# Patient Record
Sex: Female | Born: 1995 | Race: Black or African American | Hispanic: No | Marital: Single | Smoking: Never smoker
Health system: Southern US, Community
[De-identification: ages and names within clinical notes are randomized; demographics above are authoritative.]

## PROBLEM LIST (undated history)

## (undated) ENCOUNTER — Inpatient Hospital Stay (HOSPITAL_COMMUNITY): Payer: Self-pay

## (undated) DIAGNOSIS — Z3480 Encounter for supervision of other normal pregnancy, unspecified trimester: Secondary | ICD-10-CM

## (undated) DIAGNOSIS — J45909 Unspecified asthma, uncomplicated: Secondary | ICD-10-CM

## (undated) DIAGNOSIS — N809 Endometriosis, unspecified: Secondary | ICD-10-CM

## (undated) HISTORY — PX: NO PAST SURGERIES: SHX2092

## (undated) HISTORY — DX: Encounter for supervision of other normal pregnancy, unspecified trimester: Z34.80

## (undated) HISTORY — DX: Endometriosis, unspecified: N80.9

---
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
## 2014-04-21 ENCOUNTER — Inpatient Hospital Stay (HOSPITAL_COMMUNITY)
Admission: AD | Admit: 2014-04-21 | Discharge: 2014-04-21 | Discharge status: Against Medical Advice | Payer: Self-pay | Source: Ambulatory Visit | Attending: Obstetrics & Gynecology | Admitting: Obstetrics & Gynecology

## 2014-04-21 NOTE — MAU Note (Signed)
Refused to sign paperwork to be seen- pt left with out being seen

## 2016-06-06 ENCOUNTER — Encounter (HOSPITAL_COMMUNITY): Payer: Self-pay | Admitting: Emergency Medicine

## 2016-06-06 ENCOUNTER — Emergency Department (HOSPITAL_COMMUNITY)
Admission: EM | Admit: 2016-06-06 | Discharge: 2016-06-07 | Disposition: A | Discharge status: Home / Self Care | Payer: No Typology Code available for payment source | Attending: Emergency Medicine | Admitting: Emergency Medicine

## 2016-06-06 DIAGNOSIS — Y999 Unspecified external cause status: Secondary | ICD-10-CM | POA: Insufficient documentation

## 2016-06-06 DIAGNOSIS — Y92410 Unspecified street and highway as the place of occurrence of the external cause: Secondary | ICD-10-CM | POA: Insufficient documentation

## 2016-06-06 DIAGNOSIS — J45909 Unspecified asthma, uncomplicated: Secondary | ICD-10-CM | POA: Diagnosis not present

## 2016-06-06 DIAGNOSIS — S0990XA Unspecified injury of head, initial encounter: Secondary | ICD-10-CM | POA: Insufficient documentation

## 2016-06-06 DIAGNOSIS — Y939 Activity, unspecified: Secondary | ICD-10-CM | POA: Insufficient documentation

## 2016-06-06 DIAGNOSIS — R918 Other nonspecific abnormal finding of lung field: Secondary | ICD-10-CM | POA: Diagnosis not present

## 2016-06-06 DIAGNOSIS — R109 Unspecified abdominal pain: Secondary | ICD-10-CM | POA: Insufficient documentation

## 2016-06-06 DIAGNOSIS — S20219A Contusion of unspecified front wall of thorax, initial encounter: Secondary | ICD-10-CM | POA: Insufficient documentation

## 2016-06-06 DIAGNOSIS — Z79899 Other long term (current) drug therapy: Secondary | ICD-10-CM | POA: Diagnosis not present

## 2016-06-06 DIAGNOSIS — S29001A Unspecified injury of muscle and tendon of front wall of thorax, initial encounter: Secondary | ICD-10-CM | POA: Diagnosis present

## 2016-06-06 DIAGNOSIS — T07XXXA Unspecified multiple injuries, initial encounter: Secondary | ICD-10-CM

## 2016-06-06 HISTORY — DX: Unspecified asthma, uncomplicated: J45.909

## 2016-06-06 LAB — URINALYSIS, ROUTINE W REFLEX MICROSCOPIC
Bilirubin Urine: NEGATIVE
GLUCOSE, UA: NEGATIVE mg/dL
Ketones, ur: NEGATIVE mg/dL
Leukocytes, UA: NEGATIVE
Nitrite: POSITIVE — AB
Protein, ur: NEGATIVE mg/dL
SPECIFIC GRAVITY, URINE: 1.01 (ref 1.005–1.030)
pH: 6 (ref 5.0–8.0)

## 2016-06-06 LAB — CBC
HCT: 40.2 % (ref 36.0–46.0)
Hemoglobin: 13.5 g/dL (ref 12.0–15.0)
MCH: 26.3 pg (ref 26.0–34.0)
MCHC: 33.6 g/dL (ref 30.0–36.0)
MCV: 78.2 fL (ref 78.0–100.0)
Platelets: 431 10*3/uL — ABNORMAL HIGH (ref 150–400)
RBC: 5.14 MIL/uL — ABNORMAL HIGH (ref 3.87–5.11)
RDW: 13.8 % (ref 11.5–15.5)
WBC: 13.1 10*3/uL — AB (ref 4.0–10.5)

## 2016-06-06 LAB — COMPREHENSIVE METABOLIC PANEL
ALT: 17 U/L (ref 14–54)
AST: 25 U/L (ref 15–41)
Albumin: 4.7 g/dL (ref 3.5–5.0)
Alkaline Phosphatase: 90 U/L (ref 38–126)
Anion gap: 11 (ref 5–15)
BUN: 8 mg/dL (ref 6–20)
CHLORIDE: 100 mmol/L — AB (ref 101–111)
CO2: 27 mmol/L (ref 22–32)
Calcium: 9.8 mg/dL (ref 8.9–10.3)
Creatinine, Ser: 0.91 mg/dL (ref 0.44–1.00)
GFR calc Af Amer: >60 mL/min (ref >60)
GLUCOSE: 100 mg/dL — AB (ref 65–99)
Potassium: 3.9 mmol/L (ref 3.5–5.1)
Sodium: 138 mmol/L (ref 135–145)
TOTAL PROTEIN: 8 g/dL (ref 6.5–8.1)
Total Bilirubin: 0.3 mg/dL (ref 0.3–1.2)

## 2016-06-06 LAB — POC URINE PREG, ED: Preg Test, Ur: NEGATIVE

## 2016-06-06 LAB — ETHANOL

## 2016-06-06 MED ORDER — FENTANYL CITRATE (PF) 100 MCG/2ML IJ SOLN
100.0000 ug | Freq: Once | INTRAMUSCULAR | Status: AC
  Administered 2016-06-06: 100 ug via INTRAVENOUS
  Filled 2016-06-06: qty 2

## 2016-06-06 MED ORDER — IOPAMIDOL (ISOVUE-300) INJECTION 61%
INTRAVENOUS | Status: AC
  Administered 2016-06-07: 100 mL
  Filled 2016-06-06: qty 100

## 2016-06-06 MED ORDER — FOSFOMYCIN TROMETHAMINE 3 G PO PACK
3.0000 g | PACK | Freq: Once | ORAL | Status: AC
  Administered 2016-06-07: 3 g via ORAL
  Filled 2016-06-06: qty 3
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
## 2016-06-06 NOTE — ED Notes (Signed)
edp attempting a [place] iv
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
## 2016-06-06 NOTE — ED Notes (Signed)
Pt undressed completely for the xray exam

## 2016-06-06 NOTE — ED Notes (Signed)
Patient ambulated to bathroom with only assistance with getting up.

## 2016-06-06 NOTE — ED Notes (Signed)
Patient continues to pull off monitor.

## 2016-06-07 ENCOUNTER — Emergency Department (HOSPITAL_COMMUNITY): Payer: No Typology Code available for payment source

## 2016-06-07 ENCOUNTER — Encounter (HOSPITAL_COMMUNITY): Payer: Self-pay

## 2016-06-07 MED ORDER — IBUPROFEN 800 MG PO TABS: 800.0000 mg | ORAL_TABLET | Freq: Three times a day (TID) | ORAL | 0 refills | Status: DC

## 2016-06-07 NOTE — ED Notes (Signed)
Ambulated with no difficulty  Drinking ok
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
## 2016-06-07 NOTE — ED Notes (Signed)
Pt to ct 

## 2016-06-07 NOTE — ED Notes (Signed)
Pt back for ct

## 2016-06-07 NOTE — ED Notes (Signed)
Iv [place] catheter removed by the c-t tech

## 2016-06-07 NOTE — ED Notes (Signed)
Iv team here to  Attempt a a-c iv for c-t angio.  Pt not  Feeling much relief from pain

## 2016-06-07 NOTE — ED Notes (Signed)
Back to c-t for head
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
## 2016-06-09 ENCOUNTER — Encounter (HOSPITAL_BASED_OUTPATIENT_CLINIC_OR_DEPARTMENT_OTHER): Payer: Self-pay | Admitting: Emergency Medicine

## 2016-06-09 ENCOUNTER — Emergency Department (HOSPITAL_BASED_OUTPATIENT_CLINIC_OR_DEPARTMENT_OTHER)
Admission: EM | Admit: 2016-06-09 | Discharge: 2016-06-10 | Disposition: A | Discharge status: Home / Self Care | Payer: Self-pay | Attending: Emergency Medicine | Admitting: Emergency Medicine

## 2016-06-09 DIAGNOSIS — Y92410 Unspecified street and highway as the place of occurrence of the external cause: Secondary | ICD-10-CM | POA: Insufficient documentation

## 2016-06-09 DIAGNOSIS — J45909 Unspecified asthma, uncomplicated: Secondary | ICD-10-CM | POA: Insufficient documentation

## 2016-06-09 DIAGNOSIS — N3000 Acute cystitis without hematuria: Secondary | ICD-10-CM | POA: Insufficient documentation

## 2016-06-09 DIAGNOSIS — Y999 Unspecified external cause status: Secondary | ICD-10-CM | POA: Insufficient documentation

## 2016-06-09 DIAGNOSIS — S301XXA Contusion of abdominal wall, initial encounter: Secondary | ICD-10-CM | POA: Insufficient documentation

## 2016-06-09 DIAGNOSIS — Y939 Activity, unspecified: Secondary | ICD-10-CM | POA: Insufficient documentation

## 2016-06-09 LAB — URINE CULTURE
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
## 2016-06-10 ENCOUNTER — Telehealth: Payer: Self-pay | Admitting: Emergency Medicine

## 2016-06-10 MED ORDER — TRAMADOL HCL 50 MG PO TABS: 50.0000 mg | ORAL_TABLET | Freq: Four times a day (QID) | ORAL | 0 refills | Status: DC | PRN

## 2016-06-10 MED ORDER — CIPROFLOXACIN HCL 500 MG PO TABS: 500.0000 mg | ORAL_TABLET | Freq: Two times a day (BID) | ORAL | 0 refills | Status: DC

## 2016-06-10 NOTE — ED Notes (Addendum)
EDP into room, prior to RN assessment, see MD notes, pending orders, care assumed at time of d/c.

## 2016-06-10 NOTE — Discharge Instructions (Signed)
Tramadol as prescribed as needed for pain.  Cipro as prescribed for treatment of urinary tract infection.  Return to the emergency department if your symptoms significantly worsen or change, and follow-up with your primary Dr. if not improving in the next week.

## 2016-06-10 NOTE — ED Notes (Signed)
Pt not visualized by this RN

## 2016-06-10 NOTE — Telephone Encounter (Signed)
Post ED Visit - Positive Culture Follow-up  Culture report reviewed by antimicrobial stewardship pharmacist:   Enzo Bi, Pharm.D.  Celedonio Miyamoto, Pharm.D., BCPS AQ-ID  Garvin Fila, Pharm.D., BCPS  Georgina Pillion, Pharm.D., BCPS  [place], [address].D., BCPS, AAHIVP  Estella Husk, Pharm.D., BCPS, AAHIVP  Lysle Pearl, PharmD, BCPS  Casilda Carls, PharmD, BCPS  Pollyann Samples, PharmD, BCPS  Positive urine culture Treated with ciprofloxacin, organism sensitive to the same and no further patient follow-up is required at this time.  Berle Mull 06/10/2016, 9:53 AM
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
## 2016-07-31 ENCOUNTER — Encounter (HOSPITAL_BASED_OUTPATIENT_CLINIC_OR_DEPARTMENT_OTHER): Payer: Self-pay | Admitting: *Deleted

## 2016-07-31 ENCOUNTER — Emergency Department (HOSPITAL_BASED_OUTPATIENT_CLINIC_OR_DEPARTMENT_OTHER)
Admission: EM | Admit: 2016-07-31 | Discharge: 2016-07-31 | Disposition: A | Discharge status: Home / Self Care | Payer: Self-pay | Attending: Emergency Medicine | Admitting: Emergency Medicine

## 2016-07-31 DIAGNOSIS — R1013 Epigastric pain: Secondary | ICD-10-CM | POA: Insufficient documentation

## 2016-07-31 DIAGNOSIS — J45909 Unspecified asthma, uncomplicated: Secondary | ICD-10-CM | POA: Insufficient documentation

## 2016-07-31 LAB — COMPREHENSIVE METABOLIC PANEL
ALT: 18 U/L (ref 14–54)
ANION GAP: 8 (ref 5–15)
AST: 21 U/L (ref 15–41)
Albumin: 4.1 g/dL (ref 3.5–5.0)
Alkaline Phosphatase: 90 U/L (ref 38–126)
BILIRUBIN TOTAL: 0.3 mg/dL (ref 0.3–1.2)
BUN: 6 mg/dL (ref 6–20)
CO2: 24 mmol/L (ref 22–32)
Calcium: 9 mg/dL (ref 8.9–10.3)
Chloride: 107 mmol/L (ref 101–111)
Creatinine, Ser: 0.66 mg/dL (ref 0.44–1.00)
GFR calc Af Amer: >60 mL/min (ref >60)
Glucose, Bld: 96 mg/dL (ref 65–99)
POTASSIUM: 3.4 mmol/L — AB (ref 3.5–5.1)
Sodium: 139 mmol/L (ref 135–145)
TOTAL PROTEIN: 7.5 g/dL (ref 6.5–8.1)

## 2016-07-31 LAB — CBC WITH DIFFERENTIAL/PLATELET
BASOS ABS: 0 10*3/uL (ref 0.0–0.1)
Basophils Relative: 0 %
EOS PCT: 1 %
Eosinophils Absolute: 0.1 10*3/uL (ref 0.0–0.7)
HEMATOCRIT: 37.3 % (ref 36.0–46.0)
HEMOGLOBIN: 12.9 g/dL (ref 12.0–15.0)
LYMPHS PCT: 31 %
Lymphs Abs: 1.8 10*3/uL (ref 0.7–4.0)
MCH: 26.7 pg (ref 26.0–34.0)
MCHC: 34.6 g/dL (ref 30.0–36.0)
MCV: 77.1 fL — AB (ref 78.0–100.0)
Monocytes Absolute: 0.7 10*3/uL (ref 0.1–1.0)
Monocytes Relative: 13 %
NEUTROS ABS: 3.1 10*3/uL (ref 1.7–7.7)
NEUTROS PCT: 55 %
PLATELETS: 411 10*3/uL — AB (ref 150–400)
RBC: 4.84 MIL/uL (ref 3.87–5.11)
RDW: 13.3 % (ref 11.5–15.5)
WBC: 5.7 10*3/uL (ref 4.0–10.5)

## 2016-07-31 LAB — HCG, QUANTITATIVE, PREGNANCY: hCG, Beta Chain, Quant, S: <1 m[IU]/mL (ref <5)

## 2016-07-31 LAB — LIPASE, BLOOD: Lipase: 18 U/L (ref 11–51)

## 2016-07-31 MED ORDER — GI COCKTAIL ~~LOC~~
30.0000 mL | Freq: Once | ORAL | Status: AC
  Administered 2016-07-31: 30 mL via ORAL
  Filled 2016-07-31: qty 30

## 2016-07-31 NOTE — Discharge Instructions (Signed)
Prilosec 20 mg twice daily for the next 2 weeks.  Follow-up with your primary Dr. if you're not improving in the next week.
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
## 2016-12-30 ENCOUNTER — Encounter (HOSPITAL_COMMUNITY): Payer: Self-pay | Admitting: *Deleted

## 2016-12-30 ENCOUNTER — Inpatient Hospital Stay (HOSPITAL_COMMUNITY)
Admission: AD | Admit: 2016-12-30 | Discharge: 2016-12-30 | Disposition: A | Discharge status: Home / Self Care | Payer: Medicaid Other | Source: Ambulatory Visit | Attending: Obstetrics & Gynecology | Admitting: Obstetrics & Gynecology

## 2016-12-30 DIAGNOSIS — O0932 Supervision of pregnancy with insufficient antenatal care, second trimester: Secondary | ICD-10-CM | POA: Insufficient documentation

## 2016-12-30 DIAGNOSIS — R109 Unspecified abdominal pain: Secondary | ICD-10-CM | POA: Diagnosis not present

## 2016-12-30 DIAGNOSIS — Z3492 Encounter for supervision of normal pregnancy, unspecified, second trimester: Secondary | ICD-10-CM

## 2016-12-30 DIAGNOSIS — N898 Other specified noninflammatory disorders of vagina: Secondary | ICD-10-CM | POA: Diagnosis not present

## 2016-12-30 DIAGNOSIS — Z3A22 22 weeks gestation of pregnancy: Secondary | ICD-10-CM | POA: Insufficient documentation

## 2016-12-30 DIAGNOSIS — O26892 Other specified pregnancy related conditions, second trimester: Secondary | ICD-10-CM | POA: Diagnosis not present

## 2016-12-30 DIAGNOSIS — Z885 Allergy status to narcotic agent status: Secondary | ICD-10-CM | POA: Diagnosis not present

## 2016-12-30 LAB — URINALYSIS, ROUTINE W REFLEX MICROSCOPIC
BILIRUBIN URINE: NEGATIVE
Glucose, UA: NEGATIVE mg/dL
Hgb urine dipstick: NEGATIVE
Ketones, ur: NEGATIVE mg/dL
Leukocytes, UA: NEGATIVE
NITRITE: NEGATIVE
Protein, ur: NEGATIVE mg/dL
SPECIFIC GRAVITY, URINE: 1.012 (ref 1.005–1.030)
pH: 6 (ref 5.0–8.0)

## 2016-12-30 LAB — WET PREP, GENITAL
Clue Cells Wet Prep HPF POC: NONE SEEN
SPERM: NONE SEEN
TRICH WET PREP: NONE SEEN
YEAST WET PREP: NONE SEEN

## 2016-12-30 NOTE — MAU Note (Signed)
Pt presents with c/o upper shooting abdominal pain that began 2 weeks ago.  Denies VB.  Denies heavy lifting.

## 2016-12-30 NOTE — MAU Provider Note (Signed)
History    CSN: [id number]  Arrival date and time: 12/30/16 0900   First Provider Initiated Contact with Patient 12/30/16 [phone]      Chief Complaint  Patient presents with  . Abdominal Pain   HPI  Anne Jensen is a 21 y/o G1P0 who presents today for abdominal pain for the last 2 weeks that she describes as cramping and shooting in nature. The pain is intermittent, localized to the level of the umbilicus and starts in one side of her abdomen and radiates across to the other side. She rates the pain as a 6/10 when it occurs. The patient reports that stressful situations at her old job causes onset of the pain, and that massaging the area or applying a warm wash cloth to her stomach alleviates the pain. The patient reports feeling normal fetal movement. Patient reports a white mucus discharge that has been present since she became pregnant. Patient denies pain radiating to the back, low back pain, nausea, vomiting, bleeding, leaking of fluids, headache, fever, urinary urgency or dysuria. She has not received any prenatal care since she found out she was [redacted] weeks pregnant.   OB History    Gravida Para Term Preterm AB Living   1 0 0 0 0 0   SAB TAB Ectopic Multiple Live Births   0 0 0 0        Past Medical History:  Diagnosis Date  . Asthma     Past Surgical History:  Procedure Laterality Date  . NO PAST SURGERIES      History reviewed. No pertinent family history.  Social History  Substance Use Topics  . Smoking status: Never Smoker  . Smokeless tobacco: Never Used  . Alcohol use No    Allergies:  Allergies  Allergen Reactions  . Morphine And Related Other (See Comments)    Makes skin burn    No prescriptions prior to admission.    Review of Systems  Constitutional: Negative for fever.  HENT: Negative.   Eyes: Negative.   Respiratory: Negative for shortness of breath.   Cardiovascular: Negative for chest pain.  Gastrointestinal: Positive for abdominal pain.  Negative for constipation, diarrhea, nausea and vomiting.  Genitourinary: Positive for vaginal discharge (white mucus consistency present throughout entire pregnancy). Negative for dysuria, flank pain, hematuria, pelvic pain, urgency and vaginal bleeding.  Musculoskeletal: Negative for back pain.  Skin: Negative.   Neurological: Negative for headaches.  Psychiatric/Behavioral: Negative.    Physical Exam   Blood pressure 121/64, pulse 83, temperature 98.3 F (36.8 C), temperature source Oral, resp. rate 20, height 5\' 6"  (1.676 m), weight 101.8 kg (224 lb 8 oz), last menstrual period 07/26/2016, SpO2 99 %, not currently breastfeeding.  Physical Exam  Constitutional: She is oriented to person, place, and time. She appears well-developed and well-nourished.  HENT:  Head: Normocephalic and atraumatic.  Cardiovascular: Normal rate and regular rhythm.   Respiratory: Effort normal and breath sounds normal.  GI: Soft. Bowel sounds are normal. There is no tenderness. There is no rebound and no guarding.  Genitourinary: Cervix exhibits no discharge and no friability. Vaginal discharge found.  Genitourinary Comments: Cervix is closed  Musculoskeletal: Normal range of motion.  Neurological: She is alert and oriented to person, place, and time.  Skin: Skin is warm and dry.  Psychiatric: She has a normal mood and affect. Her behavior is normal. Thought content normal.    MAU Course  Procedures  MDM Pelvic exam GC/CHL swab Wet prep Urinalysis  Assessment and Plan  1. Abdominal pain in pregnancy - Abdominal pain from normal uterine enlargement during pregnancy. Patient was advised to return if she experiences contractions that occur regularly and frequently and 6 or more in 1 hour, as well as vaginal bleeding. 2. Late prenatal care - Patient was given a list of providers in the area and encouraged to follow-up immediately to initiate prenatal care. 3 Wet prep - negative 4. Urinalysis -  negative 5. GC/CHL - results pending, patient advised that she will be notified if the results come back positive for GC or CHL.  Julieanne Mansonebecca Antuan Limes, PA-S 12/30/2016, 11:02 AM

## 2016-12-30 NOTE — MAU Provider Note (Signed)
History     CSN: [id number]  Arrival date and time: 12/30/16 0900   First Provider Initiated Contact with Patient 12/30/16 [phone]      Chief Complaint  Patient presents with  . Abdominal Pain   HPI Anne Jensen is a 21 y.o. G1P0000 at [id number] who presents with abdominal pain. Patient has had no prenatal care during this pregnancy. Reports intermittent lower abdominal pain for the last 2 weeks. Can't tell how frequently pain is occurring but dose not occur more than once per hour. Describes as shooting pain that radiates from level of umbilicus down to bilateral lower abdomen. Rates pain 6/10 when it occurs. Endorses small amount of thin white discharge with the pregnancy. No vaginal irritation or odor. Denies n/v/d, constipation, dysuria, or vaginal bleeding. Positive fetal movement.   OB History    Gravida Para Term Preterm AB Living   1 0 0 0 0 0   SAB TAB Ectopic Multiple Live Births   0 0 0 0        Past Medical History:  Diagnosis Date  . Asthma     Past Surgical History:  Procedure Laterality Date  . NO PAST SURGERIES      History reviewed. No pertinent family history.  Social History  Substance Use Topics  . Smoking status: Never Smoker  . Smokeless tobacco: Never Used  . Alcohol use No    Allergies:  Allergies  Allergen Reactions  . Morphine And Related Other (See Comments)    Makes skin burn    No prescriptions prior to admission.    Review of Systems  Constitutional: Negative.   Gastrointestinal: Positive for abdominal pain. Negative for constipation, diarrhea, nausea and vomiting.  Genitourinary: Positive for vaginal discharge. Negative for dysuria and vaginal bleeding.   Physical Exam   Blood pressure 101/61, pulse 75, temperature 98 F (36.7 C), temperature source Oral, resp. rate 18, height 5\' 6"  (1.676 m), weight 224 lb 8 oz (101.8 kg), last menstrual period 07/26/2016, SpO2 100 %, not currently breastfeeding.  Physical Exam  Nursing  note and vitals reviewed. Constitutional: She is oriented to person, place, and time. She appears well-developed and well-nourished. No distress.  HENT:  Head: Normocephalic and atraumatic.  Eyes: Conjunctivae are normal. Right eye exhibits no discharge. Left eye exhibits no discharge. No scleral icterus.  Neck: Normal range of motion.  Respiratory: Effort normal. No respiratory distress.  GI: Soft. There is no tenderness.  Fundal height at umbilicus  Genitourinary: Vaginal discharge (small amount of physiologic discharge) found.  Genitourinary Comments: Cervix closed/thick  Neurological: She is alert and oriented to person, place, and time.  Skin: Skin is warm and dry. She is not diaphoretic.  Psychiatric: She has a normal mood and affect. Her behavior is normal. Judgment and thought content normal.    MAU Course  Procedures Results for orders placed or performed during the hospital encounter of 12/30/16 (from the past 24 hour(s))  Urinalysis, Routine w reflex microscopic     Status: None   Collection Time: 12/30/16  9:15 AM  Result Value Ref Range   Color, Urine YELLOW YELLOW   APPearance CLEAR CLEAR   Specific Gravity, Urine 1.012 1.005 - 1.030   pH 6.0 5.0 - 8.0   Glucose, UA NEGATIVE NEGATIVE mg/dL   Hgb urine dipstick NEGATIVE NEGATIVE   Bilirubin Urine NEGATIVE NEGATIVE   Ketones, ur NEGATIVE NEGATIVE mg/dL   Protein, ur NEGATIVE NEGATIVE mg/dL   Nitrite NEGATIVE NEGATIVE   Leukocytes,  UA NEGATIVE NEGATIVE  Wet prep, genital     Status: Abnormal   Collection Time: 12/30/16 10:35 AM  Result Value Ref Range   Yeast Wet Prep HPF POC NONE SEEN NONE SEEN   Trich, Wet Prep NONE SEEN NONE SEEN   Clue Cells Wet Prep HPF POC NONE SEEN NONE SEEN   WBC, Wet Prep HPF POC FEW (A) NONE SEEN   Sperm NONE SEEN     MDM FHT 145 Cervix closed GC/CT & wet prep collected  Assessment and Plan  A: 1. Abdominal pain during pregnancy in second trimester   2. Fetal heart tones  present, second trimester   3. Vaginal discharge during pregnancy in second trimester   4. [redacted] weeks gestation of pregnancy   5. No prenatal care in current pregnancy in second trimester    P: Discharge home Start prenatal care ASAP --- given list of ob providers GC/CT pending Discussed reasons to return to MAU  Judeth Horn 12/30/2016, 7:40 PM

## 2016-12-30 NOTE — Discharge Instructions (Signed)
Second Trimester of Pregnancy The second trimester is from week 14 through week 27 (months 4 through 6). The second trimester is often a time when you feel your best. Your body has adjusted to being pregnant, and you begin to feel better physically. Usually, morning sickness has lessened or quit completely, you may have more energy, and you may have an increase in appetite. The second trimester is also a time when the fetus is growing rapidly. At the end of the sixth month, the fetus is about 9 inches long and weighs about 1 pounds. You will likely begin to feel the baby move (quickening) between 16 and 20 weeks of pregnancy. Body changes during your second trimester Your body continues to go through many changes during your second trimester. The changes vary from woman to woman.  Your weight will continue to increase. You will notice your lower abdomen bulging out.  You may begin to get stretch marks on your hips, abdomen, and breasts.  You may develop headaches that can be relieved by medicines. The medicines should be approved by your health care provider.  You may urinate more often because the fetus is pressing on your bladder.  You may develop or continue to have heartburn as a result of your pregnancy.  You may develop constipation because certain hormones are causing the muscles that push waste through your intestines to slow down.  You may develop hemorrhoids or swollen, bulging veins (varicose veins).  You may have back pain. This is caused by: ? Weight gain. ? Pregnancy hormones that are relaxing the joints in your pelvis. ? A shift in weight and the muscles that support your balance.  Your breasts will continue to grow and they will continue to become tender.  Your gums may bleed and may be sensitive to brushing and flossing.  Dark spots or blotches (chloasma, mask of pregnancy) may develop on your face. This will likely fade after the baby is born.  A dark line from your  belly button to the pubic area (linea nigra) may appear. This will likely fade after the baby is born.  You may have changes in your hair. These can include thickening of your hair, rapid growth, and changes in texture. Some women also have hair loss during or after pregnancy, or hair that feels dry or thin. Your hair will most likely return to normal after your baby is born.  What to expect at prenatal visits During a routine prenatal visit:  You will be weighed to make sure you and the fetus are growing normally.  Your blood pressure will be taken.  Your abdomen will be measured to track your baby's growth.  The fetal heartbeat will be listened to.  Any test results from the previous visit will be discussed.  Your health care provider may ask you:  How you are feeling.  If you are feeling the baby move.  If you have had any abnormal symptoms, such as leaking fluid, bleeding, severe headaches, or abdominal cramping.  If you are using any tobacco products, including cigarettes, chewing tobacco, and electronic cigarettes.  If you have any questions.  Other tests that may be performed during your second trimester include:  Blood tests that check for: ? Low iron levels (anemia). ? High blood sugar that affects pregnant women (gestational diabetes) between 13 and 28 weeks. ? Rh antibodies. This is to check for a protein on red blood cells (Rh factor).  Urine tests to check for infections, diabetes, or  protein in the urine.  An ultrasound to confirm the proper growth and development of the baby.  An amniocentesis to check for possible genetic problems.  Fetal screens for spina bifida and Down syndrome.  HIV (human immunodeficiency virus) testing. Routine prenatal testing includes screening for HIV, unless you choose not to have this test.  Follow these instructions at home: Medicines  Follow your health care provider's instructions regarding medicine use. Specific  medicines may be either safe or unsafe to take during pregnancy.  Take a prenatal vitamin that contains at least 600 micrograms (mcg) of folic acid.  If you develop constipation, try taking a stool softener if your health care provider approves. Eating and drinking  Eat a balanced diet that includes fresh fruits and vegetables, whole grains, good sources of protein such as meat, eggs, or tofu, and low-fat dairy. Your health care provider will help you determine the amount of weight gain that is right for you.  Avoid raw meat and uncooked cheese. These carry germs that can cause birth defects in the baby.  If you have low calcium intake from food, talk to your health care provider about whether you should take a daily calcium supplement.  Limit foods that are high in fat and processed sugars, such as fried and sweet foods.  To prevent constipation: ? Drink enough fluid to keep your urine clear or pale yellow. ? Eat foods that are high in fiber, such as fresh fruits and vegetables, whole grains, and beans. Activity  Exercise only as directed by your health care provider. Most women can continue their usual exercise routine during pregnancy. Try to exercise for 30 minutes at least 5 days a week. Stop exercising if you experience uterine contractions.  Avoid heavy lifting, wear low heel shoes, and practice good posture.  A sexual relationship may be continued unless your health care provider directs you otherwise. Relieving pain and discomfort  Wear a good support bra to prevent discomfort from breast tenderness.  Take warm sitz baths to soothe any pain or discomfort caused by hemorrhoids. Use hemorrhoid cream if your health care provider approves.  Rest with your legs elevated if you have leg cramps or low back pain.  If you develop varicose veins, wear support hose. Elevate your feet for 15 minutes, 3-4 times a day. Limit salt in your diet. Prenatal Care  Write down your questions.  Take them to your prenatal visits.  Keep all your prenatal visits as told by your health care provider. This is important. Safety  Wear your seat belt at all times when driving.  Make a list of emergency phone numbers, including numbers for family, friends, the hospital, and police and fire departments. General instructions  Ask your health care provider for a referral to a local prenatal education class. Begin classes no later than the beginning of month 6 of your pregnancy.  Ask for help if you have counseling or nutritional needs during pregnancy. Your health care provider can offer advice or refer you to specialists for help with various needs.  Do not use hot tubs, steam rooms, or saunas.  Do not douche or use tampons or scented sanitary pads.  Do not cross your legs for long periods of time.  Avoid cat litter boxes and soil used by cats. These carry germs that can cause birth defects in the baby and possibly loss of the fetus by miscarriage or stillbirth.  Avoid all smoking, herbs, alcohol, and unprescribed drugs. Chemicals in these products can  affect the formation and growth of the baby.  Do not use any products that contain nicotine or tobacco, such as cigarettes and e-cigarettes. If you need help quitting, ask your health care provider.  Visit your dentist if you have not gone yet during your pregnancy. Use a soft toothbrush to brush your teeth and be gentle when you floss. Contact a health care provider if:  You have dizziness.  You have mild pelvic cramps, pelvic pressure, or nagging pain in the abdominal area.  You have persistent nausea, vomiting, or diarrhea.  You have a bad smelling vaginal discharge.  You have pain when you urinate. Get help right away if:  You have a fever.  You are leaking fluid from your vagina.  You have spotting or bleeding from your vagina.  You have severe abdominal cramping or pain.  You have rapid weight gain or weight  loss.  You have shortness of breath with chest pain.  You notice sudden or extreme swelling of your face, hands, ankles, feet, or legs.  You have not felt your baby move in over an hour.  You have severe headaches that do not go away when you take medicine.  You have vision changes. Summary  The second trimester is from week 14 through week 27 (months 4 through 6). It is also a time when the fetus is growing rapidly.  Your body goes through many changes during pregnancy. The changes vary from woman to woman.  Avoid all smoking, herbs, alcohol, and unprescribed drugs. These chemicals affect the formation and growth your baby.  Do not use any tobacco products, such as cigarettes, chewing tobacco, and e-cigarettes. If you need help quitting, ask your health care provider.  Contact your health care provider if you have any questions. Keep all prenatal visits as told by your health care provider. This is important. This information is not intended to replace advice given to you by your health care provider. Make sure you discuss any questions you have with your health care provider. Document Released: 02/12/2001 Document Revised: 07/27/2015 Document Reviewed: 04/21/2012 Elsevier Interactive Patient Education  2017 [employer]. Preterm Labor and Birth Information The normal length of a pregnancy is 39-41 weeks. Preterm labor is when labor starts before 37 completed weeks of pregnancy. What are the risk factors for preterm labor? Preterm labor is more likely to occur in women who:  Have certain infections during pregnancy such as a bladder infection, sexually transmitted infection, or infection inside the uterus (chorioamnionitis).  Have a shorter-than-normal cervix.  Have gone into preterm labor before.  Have had surgery on their cervix.  Are younger than age 49 or older than age 87.  Are African American.  Are pregnant with twins or multiple babies (multiple gestation).  Take  street drugs or smoke while pregnant.  Do not gain enough weight while pregnant.  Became pregnant shortly after having been pregnant.  What are the symptoms of preterm labor? Symptoms of preterm labor include:  Cramps similar to those that can happen during a menstrual period. The cramps may happen with diarrhea.  Pain in the abdomen or lower back.  Regular uterine contractions that may feel like tightening of the abdomen.  A feeling of increased pressure in the pelvis.  Increased watery or bloody mucus discharge from the vagina.  Water breaking (ruptured amniotic sac).  Why is it important to recognize signs of preterm labor? It is important to recognize signs of preterm labor because babies who are born prematurely  may not be fully developed. This can put them at an increased risk for:  Long-term (chronic) heart and lung problems.  Difficulty immediately after birth with regulating body systems, including blood sugar, body temperature, heart rate, and breathing rate.  Bleeding in the brain.  Cerebral palsy.  Learning difficulties.  Death.  These risks are highest for babies who are born before 34 weeks of pregnancy. How is preterm labor treated? Treatment depends on the length of your pregnancy, your condition, and the health of your baby. It may involve:  Having a stitch (suture) placed in your cervix to prevent your cervix from opening too early (cerclage).  Taking or being given medicines, such as: ? Hormone medicines. These may be given early in pregnancy to help support the pregnancy. ? Medicine to stop contractions. ? Medicines to help mature the babys lungs. These may be prescribed if the risk of delivery is high. ? Medicines to prevent your baby from developing cerebral palsy.  If the labor happens before 34 weeks of pregnancy, you may need to stay in the hospital. What should I do if I think I am in preterm labor? If you think that you are going into  preterm labor, call your health care provider right away. How can I prevent preterm labor in future pregnancies? To increase your chance of having a full-term pregnancy:  Do not use any tobacco products, such as cigarettes, chewing tobacco, and e-cigarettes. If you need help quitting, ask your health care provider.  Do not use street drugs or medicines that have not been prescribed to you during your pregnancy.  Talk with your health care provider before taking any herbal supplements, even if you have been taking them regularly.  Make sure you gain a healthy amount of weight during your pregnancy.  Watch for infection. If you think that you might have an infection, get it checked right away.  Make sure to tell your health care provider if you have gone into preterm labor before.  This information is not intended to replace advice given to you by your health care provider. Make sure you discuss any questions you have with your health care provider. Document Released: 05/11/2003 Document Revised: 08/01/2015 Document Reviewed: 07/12/2015 Elsevier Interactive Patient Education  2018 [employer].       [facility] Prenatal Care Providers   [facility]       Phone: [phone]  [facility] [place] Phone: [phone]  [facility] [place]  Phone: [phone]  [facility]  Phone: [phone]  [facility] [place]  Phone: [phone]  [facility]       Phone: [phone]  [facility]    Phone: [phone]   [facility]    Phone: [phone]  [facility]    Phone: [phone]  [facility]    Phone: [phone]   [facility]  Phone: [phone]  [facility]    Phone: [phone]  [facility]   Phone: [phone]  [facility]    Phone: [phone]

## 2016-12-31 LAB — GC/CHLAMYDIA PROBE AMP (~~LOC~~) NOT AT ARMC
Chlamydia: NEGATIVE
NEISSERIA GONORRHEA: NEGATIVE

## 2017-02-02 ENCOUNTER — Other Ambulatory Visit: Payer: Self-pay

## 2017-02-02 ENCOUNTER — Emergency Department (HOSPITAL_COMMUNITY)
Admission: EM | Admit: 2017-02-02 | Discharge: 2017-02-02 | Disposition: A | Discharge status: Home / Self Care | Payer: Medicaid Other | Attending: Emergency Medicine | Admitting: Emergency Medicine

## 2017-02-02 ENCOUNTER — Encounter (HOSPITAL_COMMUNITY): Payer: Self-pay

## 2017-02-02 ENCOUNTER — Inpatient Hospital Stay (EMERGENCY_DEPARTMENT_HOSPITAL)
Admission: AD | Admit: 2017-02-02 | Discharge: 2017-02-02 | Disposition: A | Discharge status: Home / Self Care | Payer: Medicaid Other | Source: Ambulatory Visit | Attending: Obstetrics & Gynecology | Admitting: Obstetrics & Gynecology

## 2017-02-02 ENCOUNTER — Encounter (HOSPITAL_COMMUNITY): Payer: Self-pay | Admitting: Emergency Medicine

## 2017-02-02 DIAGNOSIS — Z3689 Encounter for other specified antenatal screening: Secondary | ICD-10-CM

## 2017-02-02 DIAGNOSIS — R200 Anesthesia of skin: Secondary | ICD-10-CM | POA: Insufficient documentation

## 2017-02-02 DIAGNOSIS — Z3A27 27 weeks gestation of pregnancy: Secondary | ICD-10-CM | POA: Diagnosis not present

## 2017-02-02 DIAGNOSIS — O9989 Other specified diseases and conditions complicating pregnancy, childbirth and the puerperium: Secondary | ICD-10-CM | POA: Diagnosis present

## 2017-02-02 DIAGNOSIS — O26892 Other specified pregnancy related conditions, second trimester: Secondary | ICD-10-CM | POA: Insufficient documentation

## 2017-02-02 DIAGNOSIS — O0932 Supervision of pregnancy with insufficient antenatal care, second trimester: Secondary | ICD-10-CM

## 2017-02-02 DIAGNOSIS — O368120 Decreased fetal movements, second trimester, not applicable or unspecified: Secondary | ICD-10-CM | POA: Diagnosis not present

## 2017-02-02 DIAGNOSIS — Z3A24 24 weeks gestation of pregnancy: Secondary | ICD-10-CM | POA: Insufficient documentation

## 2017-02-02 DIAGNOSIS — O209 Hemorrhage in early pregnancy, unspecified: Secondary | ICD-10-CM | POA: Diagnosis not present

## 2017-02-02 DIAGNOSIS — Z5321 Procedure and treatment not carried out due to patient leaving prior to being seen by health care provider: Secondary | ICD-10-CM | POA: Insufficient documentation

## 2017-02-02 LAB — URINALYSIS, MICROSCOPIC (REFLEX)

## 2017-02-02 LAB — RAPID URINE DRUG SCREEN, HOSP PERFORMED
AMPHETAMINES: NOT DETECTED
BENZODIAZEPINES: NOT DETECTED
Barbiturates: NOT DETECTED
Cocaine: NOT DETECTED
OPIATES: NOT DETECTED
TETRAHYDROCANNABINOL: POSITIVE — AB

## 2017-02-02 LAB — URINALYSIS, ROUTINE W REFLEX MICROSCOPIC
Bilirubin Urine: NEGATIVE
GLUCOSE, UA: NEGATIVE mg/dL
Hgb urine dipstick: NEGATIVE
Ketones, ur: NEGATIVE mg/dL
Nitrite: NEGATIVE
PROTEIN: NEGATIVE mg/dL
SPECIFIC GRAVITY, URINE: 1.01 (ref 1.005–1.030)
pH: 6.5 (ref 5.0–8.0)

## 2017-02-02 NOTE — Discharge Instructions (Signed)
Second Trimester of Pregnancy The second trimester is from week 13 through week 28, month 4 through 6. This is often the time in pregnancy that you feel your best. Often times, morning sickness has lessened or quit. You may have more energy, and you may get hungry more often. Your unborn baby (fetus) is growing rapidly. At the end of the sixth month, he or she is about 9 inches long and weighs about 1 pounds. You will likely feel the baby move (quickening) between 18 and 20 weeks of pregnancy. Follow these instructions at home:  Avoid all smoking, herbs, and alcohol. Avoid drugs not approved by your doctor.  Do not use any tobacco products, including cigarettes, chewing tobacco, and electronic cigarettes. If you need help quitting, ask your doctor. You may get counseling or other support to help you quit.  Only take medicine as told by your doctor. Some medicines are safe and some are not during pregnancy.  Exercise only as told by your doctor. Stop exercising if you start having cramps.  Eat regular, healthy meals.  Wear a good support bra if your breasts are tender.  Do not use hot tubs, steam rooms, or saunas.  Wear your seat belt when driving.  Avoid raw meat, uncooked cheese, and liter boxes and soil used by cats.  Take your prenatal vitamins.  Take 1500-2000 milligrams of calcium daily starting at the 20th week of pregnancy until you deliver your baby.  Try taking medicine that helps you poop (stool softener) as needed, and if your doctor approves. Eat more fiber by eating fresh fruit, vegetables, and whole grains. Drink enough fluids to keep your pee (urine) clear or pale yellow.  Take warm water baths (sitz baths) to soothe pain or discomfort caused by hemorrhoids. Use hemorrhoid cream if your doctor approves.  If you have puffy, bulging veins (varicose veins), wear support hose. Raise (elevate) your feet for 15 minutes, 3-4 times a day. Limit salt in your diet.  Avoid heavy  lifting, wear low heals, and sit up straight.  Rest with your legs raised if you have leg cramps or low back pain.  Visit your dentist if you have not gone during your pregnancy. Use a soft toothbrush to brush your teeth. Be gentle when you floss.  You can have sex (intercourse) unless your doctor tells you not to.  Go to your doctor visits. Get help if:  You feel dizzy.  You have mild cramps or pressure in your lower belly (abdomen).  You have a nagging pain in your belly area.  You continue to feel sick to your stomach (nauseous), throw up (vomit), or have watery poop (diarrhea).  You have bad smelling fluid coming from your vagina.  You have pain with peeing (urination). Get help right away if:  You have a fever.  You are leaking fluid from your vagina.  You have spotting or bleeding from your vagina.  You have severe belly cramping or pain.  You lose or gain weight rapidly.  You have trouble catching your breath and have chest pain.  You notice sudden or extreme puffiness (swelling) of your face, hands, ankles, feet, or legs.  You have not felt the baby move in over an hour.  You have severe headaches that do not go away with medicine.  You have vision changes. This information is not intended to replace advice given to you by your health care provider. Make sure you discuss any questions you have with your health care   provider. Document Released: 05/15/2009 Document Revised: 07/27/2015 Document Reviewed: 04/21/2012 Elsevier Interactive Patient Education  2017 Elsevier Inc.  

## 2017-02-02 NOTE — ED Triage Notes (Addendum)
Reports feeling numbness to Left lower leg after sitting in the floor.  Coming and going for two days.  Also worried that she hasn't felt the baby move since noon on Saturday.  Reports being 6 months pregnant.  Last US at 8 weeks.  Has not seen obgyn.  Having baby check ups in the ed per pt.  [facility].  No pain since.  Did reports spotting today.

## 2017-02-02 NOTE — ED Notes (Signed)
Pt not in waiting room when called to room.

## 2017-02-02 NOTE — MAU Note (Addendum)
Abdominal pain since yesterday, was upper abdominal pain now lower.  No prenatal care other than visits to MAU.

## 2017-02-02 NOTE — MAU Provider Note (Signed)
History     CSN: [id number]  Arrival date and time: 02/02/17 1202   First Provider Initiated Contact with Patient 02/02/17 1313      Chief Complaint  Patient presents with  . Abdominal Pain   HPI   [name].Anne Jensen is a 21 y.o. female G1P0000 @ 399w2d here in MAU with complaints of decreased fetal movement. States she has not felt her baby move since last night. States she still has not felt her baby kick. Says she put her hand on her stomach and could not feel the babies heart beat, which she normally feels.  No vaginal bleeding. Having some upper abdominal pain. Says her sister had a baby around the same gestation as she is now due to the placenta. Patient is requesting an US today since she has not started prenatal care.   States she has an appointment with Dr. Shawnie Ponsorn on 02/10/2017  OB History    Gravida Para Term Preterm AB Living   1 0 0 0 0 0   SAB TAB Ectopic Multiple Live Births   0 0 0 0        Past Medical History:  Diagnosis Date  . Asthma     Past Surgical History:  Procedure Laterality Date  . NO PAST SURGERIES      History reviewed. No pertinent family history.  Social History   Tobacco Use  . Smoking status: Never Jensen  . Smokeless tobacco: Never Used  Substance Use Topics  . Alcohol use: No  . Drug use: No    Allergies:  Allergies  Allergen Reactions  . Morphine And Related Other (See Comments)    Makes skin burn    Medications Prior to Admission  Medication Sig Dispense Refill Last Dose  . Prenatal Vit-Fe Fumarate-FA (PRENATAL MULTIVITAMIN) TABS tablet Take 1 tablet by mouth daily at 12 noon.   02/01/2017 at Unknown time   Results for orders placed or performed during the hospital encounter of 02/02/17 (from the past 48 hour(s))  Urinalysis, Routine w reflex microscopic     Status: Abnormal   Collection Time: 02/02/17 12:15 PM  Result Value Ref Range   Color, Urine YELLOW YELLOW   APPearance HAZY (A) CLEAR   Specific Gravity, Urine  1.010 1.005 - 1.030   pH 6.5 5.0 - 8.0   Glucose, UA NEGATIVE NEGATIVE mg/dL   Hgb urine dipstick NEGATIVE NEGATIVE   Bilirubin Urine NEGATIVE NEGATIVE   Ketones, ur NEGATIVE NEGATIVE mg/dL   Protein, ur NEGATIVE NEGATIVE mg/dL   Nitrite NEGATIVE NEGATIVE   Leukocytes, UA MODERATE (A) NEGATIVE  Urine rapid drug screen (hosp performed)     Status: Abnormal   Collection Time: 02/02/17 12:15 PM  Result Value Ref Range   Opiates NONE DETECTED NONE DETECTED   Cocaine NONE DETECTED NONE DETECTED   Benzodiazepines NONE DETECTED NONE DETECTED   Amphetamines NONE DETECTED NONE DETECTED   Tetrahydrocannabinol POSITIVE (A) NONE DETECTED   Barbiturates NONE DETECTED NONE DETECTED    Comment:        DRUG SCREEN FOR MEDICAL PURPOSES ONLY.  IF CONFIRMATION IS NEEDED FOR ANY PURPOSE, NOTIFY LAB WITHIN 5 DAYS.        LOWEST DETECTABLE LIMITS FOR URINE DRUG SCREEN Drug Class       Cutoff (ng/mL) Amphetamine      1000 Barbiturate      200 Benzodiazepine   200 Tricyclics       300 Opiates  300 Cocaine          300 THC              50   Urinalysis, Microscopic (reflex)     Status: Abnormal   Collection Time: 02/02/17 12:15 PM  Result Value Ref Range   RBC / HPF 0-5 0 - 5 RBC/hpf   WBC, UA 0-5 0 - 5 WBC/hpf    Comment: REPEATED TO VERIFY   Bacteria, UA FEW (A) NONE SEEN   Squamous Epithelial / LPF 0-5 (A) NONE SEEN   Review of Systems  Constitutional: Negative for fever.  Gastrointestinal: Positive for abdominal pain.  Genitourinary: Negative for dysuria.   Physical Exam   Blood pressure (!) 106/59, pulse 93, temperature 98.2 F (36.8 C), resp. rate 18, last menstrual period 07/26/2016.  Physical Exam  Constitutional: She is oriented to person, place, and time. She appears well-developed and well-nourished. No distress.  HENT:  Head: Normocephalic.  Eyes: Pupils are equal, round, and reactive to light.  GI: Soft. She exhibits no distension. There is no tenderness. There  is no rebound.  Musculoskeletal: Normal range of motion.  Neurological: She is alert and oriented to person, place, and time.  Skin: Skin is warm. She is not diaphoretic.  Psychiatric: Her behavior is normal.   Fetal Tracing: Baseline: 135 bpm Variability: Moderate  Accelerations: 15x15 Decelerations: None Toco: None  MAU Course  Procedures  None  MDM  Audible fetal kicks heard on fetal monitor.  UA UDS Apple juice given to the patient; patient feeling regular movements since oral hydration.   Assessment and Plan   A:  Problem List Items Addressed This Visit    None    Visit Diagnoses    No prenatal care in current pregnancy in second trimester    -  Primary   Relevant Orders   US MFM OB DETAIL +14 WK   Decreased fetal movements in second trimester, single or unspecified fetus       NST (non-stress test) reactive          P:   Discharge home with strict return precautions Reactive NST today US ordered for anatomy scan Keep your appointment with Dr. Shawnie Ponsorn as scheduled Return to MAU if symptoms [facility]  Jaydynn Wolford, Harolyn RutherfordJennifer I, NP 02/02/2017 9:04 PM

## 2017-02-02 NOTE — Progress Notes (Signed)
D/c instructions given with pt understanding. Pt left unit via ambulatory.

## 2017-02-05 ENCOUNTER — Ambulatory Visit (HOSPITAL_COMMUNITY)
Admission: RE | Admit: 2017-02-05 | Discharge: 2017-02-05 | Disposition: A | Discharge status: Home / Self Care | Payer: Medicaid Other | Source: Ambulatory Visit | Attending: Obstetrics and Gynecology | Admitting: Obstetrics and Gynecology

## 2017-02-05 ENCOUNTER — Other Ambulatory Visit (HOSPITAL_COMMUNITY): Payer: Self-pay | Admitting: Obstetrics and Gynecology

## 2017-02-05 ENCOUNTER — Other Ambulatory Visit (HOSPITAL_COMMUNITY): Payer: Self-pay | Admitting: *Deleted

## 2017-02-05 DIAGNOSIS — E669 Obesity, unspecified: Secondary | ICD-10-CM | POA: Diagnosis not present

## 2017-02-05 DIAGNOSIS — Z3A27 27 weeks gestation of pregnancy: Secondary | ICD-10-CM | POA: Insufficient documentation

## 2017-02-05 DIAGNOSIS — O0932 Supervision of pregnancy with insufficient antenatal care, second trimester: Secondary | ICD-10-CM

## 2017-02-05 DIAGNOSIS — Z3689 Encounter for other specified antenatal screening: Secondary | ICD-10-CM | POA: Diagnosis not present

## 2017-02-05 DIAGNOSIS — O99212 Obesity complicating pregnancy, second trimester: Secondary | ICD-10-CM

## 2017-02-05 DIAGNOSIS — Q6689 Other  specified congenital deformities of feet: Secondary | ICD-10-CM

## 2017-02-26 ENCOUNTER — Other Ambulatory Visit (HOSPITAL_COMMUNITY): Payer: Self-pay | Admitting: Maternal and Fetal Medicine

## 2017-02-26 ENCOUNTER — Other Ambulatory Visit (HOSPITAL_COMMUNITY)
Admission: RE | Admit: 2017-02-26 | Discharge: 2017-02-26 | Disposition: A | Discharge status: Home / Self Care | Payer: Medicaid Other | Source: Ambulatory Visit | Attending: Obstetrics | Admitting: Obstetrics

## 2017-02-26 ENCOUNTER — Ambulatory Visit (HOSPITAL_COMMUNITY)
Admission: RE | Admit: 2017-02-26 | Discharge: 2017-02-26 | Disposition: A | Discharge status: Home / Self Care | Payer: Medicaid Other | Source: Ambulatory Visit | Attending: Obstetrics and Gynecology | Admitting: Obstetrics and Gynecology

## 2017-02-26 ENCOUNTER — Encounter: Payer: Self-pay | Admitting: Obstetrics

## 2017-02-26 ENCOUNTER — Ambulatory Visit (INDEPENDENT_AMBULATORY_CARE_PROVIDER_SITE_OTHER): Payer: Medicaid Other | Admitting: Obstetrics

## 2017-02-26 ENCOUNTER — Encounter (HOSPITAL_COMMUNITY): Payer: Self-pay

## 2017-02-26 DIAGNOSIS — Z3A30 30 weeks gestation of pregnancy: Secondary | ICD-10-CM | POA: Insufficient documentation

## 2017-02-26 DIAGNOSIS — O99213 Obesity complicating pregnancy, third trimester: Secondary | ICD-10-CM

## 2017-02-26 DIAGNOSIS — O358XX0 Maternal care for other (suspected) fetal abnormality and damage, not applicable or unspecified: Secondary | ICD-10-CM | POA: Diagnosis not present

## 2017-02-26 DIAGNOSIS — O0933 Supervision of pregnancy with insufficient antenatal care, third trimester: Secondary | ICD-10-CM | POA: Diagnosis not present

## 2017-02-26 DIAGNOSIS — Z362 Encounter for other antenatal screening follow-up: Secondary | ICD-10-CM

## 2017-02-26 DIAGNOSIS — E669 Obesity, unspecified: Secondary | ICD-10-CM | POA: Insufficient documentation

## 2017-02-26 DIAGNOSIS — Z3480 Encounter for supervision of other normal pregnancy, unspecified trimester: Secondary | ICD-10-CM

## 2017-02-26 DIAGNOSIS — Z3483 Encounter for supervision of other normal pregnancy, third trimester: Secondary | ICD-10-CM | POA: Diagnosis not present

## 2017-02-26 DIAGNOSIS — Q6689 Other  specified congenital deformities of feet: Secondary | ICD-10-CM

## 2017-02-26 DIAGNOSIS — O35HXX0 Maternal care for other (suspected) fetal abnormality and damage, fetal lower extremities anomalies, not applicable or unspecified: Secondary | ICD-10-CM

## 2017-02-26 DIAGNOSIS — O0990 Supervision of high risk pregnancy, unspecified, unspecified trimester: Secondary | ICD-10-CM | POA: Insufficient documentation

## 2017-02-26 HISTORY — DX: Encounter for supervision of other normal pregnancy, unspecified trimester: Z34.80

## 2017-02-27 LAB — OBSTETRIC PANEL, INCLUDING HIV
ANTIBODY SCREEN: NEGATIVE
BASOS: 0 %
Basophils Absolute: 0 10*3/uL (ref 0.0–0.2)
EOS (ABSOLUTE): 0.1 10*3/uL (ref 0.0–0.4)
Eos: 1 %
HEMATOCRIT: 31.1 % — AB (ref 34.0–46.6)
HIV SCREEN 4TH GENERATION: NONREACTIVE
Hemoglobin: 10.6 g/dL — ABNORMAL LOW (ref 11.1–15.9)
Hepatitis B Surface Ag: NEGATIVE
Immature Grans (Abs): 0.1 10*3/uL (ref 0.0–0.1)
Immature Granulocytes: 1 %
LYMPHS ABS: 2.1 10*3/uL (ref 0.7–3.1)
LYMPHS: 17 %
MCH: 26 pg — AB (ref 26.6–33.0)
MCHC: 34.1 g/dL (ref 31.5–35.7)
MCV: 76 fL — AB (ref 79–97)
MONOS ABS: 1.1 10*3/uL — AB (ref 0.1–0.9)
Monocytes: 9 %
NEUTROS ABS: 9.1 10*3/uL — AB (ref 1.4–7.0)
Neutrophils: 72 %
Platelets: 448 10*3/uL — ABNORMAL HIGH (ref 150–379)
RBC: 4.07 x10E6/uL (ref 3.77–5.28)
RDW: 13.8 % (ref 12.3–15.4)
RPR Ser Ql: NONREACTIVE
Rh Factor: POSITIVE
Rubella Antibodies, IGG: 1.32 index (ref >0.99)
WBC: 12.4 10*3/uL — AB (ref 3.4–10.8)

## 2017-02-28 ENCOUNTER — Encounter: Payer: Self-pay | Admitting: Obstetrics

## 2017-02-28 LAB — URINE CULTURE, OB REFLEX

## 2017-02-28 LAB — URINE CYTOLOGY ANCILLARY ONLY
Chlamydia: NEGATIVE
Neisseria Gonorrhea: NEGATIVE
Trichomonas: NEGATIVE

## 2017-02-28 LAB — CULTURE, OB URINE

## 2017-02-28 NOTE — Progress Notes (Signed)
Subjective:  Anne Jensen is a 21 y.o. G1P0000 at 343w0d being seen today for ongoing prenatal care.  She is currently monitored for the following issues for this low-risk pregnancy and has Supervision of other normal pregnancy, antepartum on their problem list.  Patient reports no complaints.  Contractions: Not present. Vag. Bleeding: None.  Movement: Present. Denies leaking of fluid.   The following portions of the patient's history were reviewed and updated as appropriate: allergies, current medications, past family history, past medical history, past social history, past surgical history and problem list. Problem list updated.  Objective:   Vitals:   02/26/17 1350  BP: 118/75  Pulse: 95  Weight: 237 lb (107.5 kg)    Fetal Status: Fetal Heart Rate (bpm): 150   Movement: Present     General:  Alert, oriented and cooperative. Patient is in no acute distress.  Skin: Skin is warm and dry. No rash noted.   Cardiovascular: Normal heart rate noted  Respiratory: Normal respiratory effort, no problems with respiration noted  Abdomen: Soft, gravid, appropriate for gestational age. Pain/Pressure: Present     Pelvic:  Cervical exam deferred        Extremities: Normal range of motion.     Mental Status: Normal mood and affect. Normal behavior. Normal judgment and thought content.   Urinalysis:      Assessment and Plan:  Pregnancy: G1P0000 at 413w0d  1. Supervision of other normal pregnancy, antepartum Rx: - Obstetric Panel, Including HIV - Culture, OB Urine - Urine cytology ancillary only  Preterm labor symptoms and general obstetric precautions including but not limited to vaginal bleeding, contractions, leaking of fluid and fetal movement were reviewed in detail with the patient. Please refer to After Visit Summary for other counseling recommendations.  Return in about 2 weeks (around 03/12/2017) for ROB.   Brock BadHarper, Glendine Swetz A, MD

## 2017-03-04 NOTE — L&D Delivery Note (Signed)
Delivery Note At 8:02 PM a viable female was delivered via Vaginal, Spontaneous (Presentation: LOA).  APGAR: 8, 9; weight pending.   Placenta status: spontaneous, intact.  Cord: 3 vessels, noted to be short.   Anesthesia:  nitrous Episiotomy:  n/a Lacerations:  n/a Suture Repair: n/a Est. Blood Loss (mL):    Mom to postpartum.  Baby to Couplet care / Skin to Skin.  Rolm BookbinderCaroline M Neill 04/30/2017, 8:12 PM  Please schedule this patient for Postpartum visit in: 4 weeks with the following provider: Any provider For C/S patients schedule nurse incision check in weeks 2 weeks: no Low risk pregnancy complicated by: n/a Delivery mode:  SVD Anticipated Birth Control:  other/unsure PP Procedures needed: n/a  Schedule Integrated BH visit: no

## 2017-03-05 ENCOUNTER — Other Ambulatory Visit: Payer: Medicaid Other

## 2017-03-05 DIAGNOSIS — Z3480 Encounter for supervision of other normal pregnancy, unspecified trimester: Secondary | ICD-10-CM

## 2017-03-06 ENCOUNTER — Other Ambulatory Visit: Payer: Self-pay | Admitting: Obstetrics

## 2017-03-06 DIAGNOSIS — D508 Other iron deficiency anemias: Secondary | ICD-10-CM

## 2017-03-06 LAB — CBC
HEMATOCRIT: 30.4 % — AB (ref 34.0–46.6)
HEMOGLOBIN: 10.5 g/dL — AB (ref 11.1–15.9)
MCH: 25.7 pg — ABNORMAL LOW (ref 26.6–33.0)
MCHC: 34.5 g/dL (ref 31.5–35.7)
MCV: 75 fL — ABNORMAL LOW (ref 79–97)
Platelets: 406 10*3/uL — ABNORMAL HIGH (ref 150–379)
RBC: 4.08 x10E6/uL (ref 3.77–5.28)
RDW: 12.8 % (ref 12.3–15.4)
WBC: 11.7 10*3/uL — ABNORMAL HIGH (ref 3.4–10.8)

## 2017-03-06 LAB — GLUCOSE TOLERANCE, 2 HOURS W/ 1HR
Glucose, 1 hour: 173 mg/dL (ref 65–179)
Glucose, 2 hour: 159 mg/dL — ABNORMAL HIGH (ref 65–152)
Glucose, Fasting: 83 mg/dL (ref 65–91)

## 2017-03-06 LAB — RPR: RPR Ser Ql: NONREACTIVE

## 2017-03-06 LAB — HIV ANTIBODY (ROUTINE TESTING W REFLEX): HIV Screen 4th Generation wRfx: NONREACTIVE

## 2017-03-06 MED ORDER — FERROUS SULFATE 325 (65 FE) MG PO TABS: 325.0000 mg | ORAL_TABLET | Freq: Every day | ORAL | 11 refills | Status: DC

## 2017-03-12 ENCOUNTER — Other Ambulatory Visit: Payer: Self-pay

## 2017-03-12 ENCOUNTER — Encounter: Payer: Self-pay | Admitting: Obstetrics and Gynecology

## 2017-03-12 ENCOUNTER — Encounter: Payer: Medicaid Other | Admitting: Obstetrics

## 2017-03-12 DIAGNOSIS — Q6689 Other  specified congenital deformities of feet: Secondary | ICD-10-CM | POA: Insufficient documentation

## 2017-03-12 DIAGNOSIS — O24419 Gestational diabetes mellitus in pregnancy, unspecified control: Secondary | ICD-10-CM | POA: Insufficient documentation

## 2017-03-12 DIAGNOSIS — D508 Other iron deficiency anemias: Secondary | ICD-10-CM

## 2017-03-12 DIAGNOSIS — J45909 Unspecified asthma, uncomplicated: Secondary | ICD-10-CM | POA: Insufficient documentation

## 2017-03-12 MED ORDER — ACCU-CHEK GUIDE W/DEVICE KIT: 1.0000 | PACK | Freq: Four times a day (QID) | 0 refills | Status: DC

## 2017-03-12 MED ORDER — GLUCOSE BLOOD VI STRP
ORAL_STRIP | 12 refills | Status: DC
Start: 2017-03-12 — End: 2017-04-29

## 2017-03-17 ENCOUNTER — Other Ambulatory Visit (HOSPITAL_COMMUNITY): Payer: Self-pay | Admitting: *Deleted

## 2017-03-17 DIAGNOSIS — O359XX0 Maternal care for (suspected) fetal abnormality and damage, unspecified, not applicable or unspecified: Secondary | ICD-10-CM

## 2017-03-17 DIAGNOSIS — Q6689 Other  specified congenital deformities of feet: Secondary | ICD-10-CM

## 2017-03-19 ENCOUNTER — Ambulatory Visit (HOSPITAL_COMMUNITY)
Admission: RE | Admit: 2017-03-19 | Discharge: 2017-03-19 | Disposition: A | Discharge status: Home / Self Care | Payer: Medicaid Other | Source: Ambulatory Visit | Attending: Obstetrics | Admitting: Obstetrics

## 2017-03-25 ENCOUNTER — Other Ambulatory Visit (HOSPITAL_COMMUNITY): Payer: Self-pay | Admitting: Maternal and Fetal Medicine

## 2017-03-25 ENCOUNTER — Encounter (HOSPITAL_COMMUNITY): Payer: Self-pay

## 2017-03-25 ENCOUNTER — Ambulatory Visit (HOSPITAL_COMMUNITY)
Admission: RE | Admit: 2017-03-25 | Discharge: 2017-03-25 | Disposition: A | Discharge status: Home / Self Care | Payer: Medicaid Other | Source: Ambulatory Visit | Attending: Obstetrics | Admitting: Obstetrics

## 2017-03-25 ENCOUNTER — Encounter: Payer: Self-pay | Admitting: Advanced Practice Midwife

## 2017-03-25 DIAGNOSIS — O359XX0 Maternal care for (suspected) fetal abnormality and damage, unspecified, not applicable or unspecified: Secondary | ICD-10-CM | POA: Diagnosis present

## 2017-03-25 DIAGNOSIS — Z3480 Encounter for supervision of other normal pregnancy, unspecified trimester: Secondary | ICD-10-CM

## 2017-03-25 DIAGNOSIS — O365930 Maternal care for other known or suspected poor fetal growth, third trimester, not applicable or unspecified: Secondary | ICD-10-CM

## 2017-03-25 DIAGNOSIS — O0933 Supervision of pregnancy with insufficient antenatal care, third trimester: Secondary | ICD-10-CM

## 2017-03-25 DIAGNOSIS — O09893 Supervision of other high risk pregnancies, third trimester: Secondary | ICD-10-CM | POA: Insufficient documentation

## 2017-03-25 DIAGNOSIS — Q6689 Other  specified congenital deformities of feet: Secondary | ICD-10-CM | POA: Insufficient documentation

## 2017-03-25 DIAGNOSIS — E669 Obesity, unspecified: Secondary | ICD-10-CM | POA: Diagnosis not present

## 2017-03-25 DIAGNOSIS — O99213 Obesity complicating pregnancy, third trimester: Secondary | ICD-10-CM

## 2017-03-25 DIAGNOSIS — O24410 Gestational diabetes mellitus in pregnancy, diet controlled: Secondary | ICD-10-CM

## 2017-03-25 DIAGNOSIS — Z3A34 34 weeks gestation of pregnancy: Secondary | ICD-10-CM | POA: Diagnosis not present

## 2017-03-25 DIAGNOSIS — IMO0002 Reserved for concepts with insufficient information to code with codable children: Secondary | ICD-10-CM | POA: Insufficient documentation

## 2017-03-26 ENCOUNTER — Ambulatory Visit: Payer: Medicaid Other | Admitting: Registered"

## 2017-04-14 ENCOUNTER — Other Ambulatory Visit (HOSPITAL_COMMUNITY): Payer: Self-pay | Admitting: *Deleted

## 2017-04-14 DIAGNOSIS — O359XX0 Maternal care for (suspected) fetal abnormality and damage, unspecified, not applicable or unspecified: Secondary | ICD-10-CM

## 2017-04-15 ENCOUNTER — Telehealth: Payer: Self-pay

## 2017-04-15 NOTE — Telephone Encounter (Signed)
Returned call, left vm.

## 2017-04-17 ENCOUNTER — Encounter: Payer: Medicaid Other | Admitting: Certified Nurse Midwife

## 2017-04-22 ENCOUNTER — Encounter (HOSPITAL_COMMUNITY): Payer: Self-pay

## 2017-04-22 ENCOUNTER — Other Ambulatory Visit (HOSPITAL_COMMUNITY): Payer: Self-pay | Admitting: Obstetrics and Gynecology

## 2017-04-22 ENCOUNTER — Ambulatory Visit (HOSPITAL_COMMUNITY)
Admission: RE | Admit: 2017-04-22 | Discharge: 2017-04-22 | Disposition: A | Discharge status: Home / Self Care | Payer: Medicaid Other | Source: Ambulatory Visit | Attending: Obstetrics | Admitting: Obstetrics

## 2017-04-22 DIAGNOSIS — Z3A38 38 weeks gestation of pregnancy: Secondary | ICD-10-CM | POA: Diagnosis not present

## 2017-04-22 DIAGNOSIS — O359XX0 Maternal care for (suspected) fetal abnormality and damage, unspecified, not applicable or unspecified: Secondary | ICD-10-CM

## 2017-04-22 DIAGNOSIS — O365930 Maternal care for other known or suspected poor fetal growth, third trimester, not applicable or unspecified: Secondary | ICD-10-CM | POA: Diagnosis not present

## 2017-04-22 NOTE — Addendum Note (Signed)
Encounter addended by: Lenise ArenaBazemore, Klark Vanderhoef, RDMS on: 04/22/2017 1:00 PM  Actions taken: Imaging Exam ended

## 2017-04-25 ENCOUNTER — Encounter: Payer: Medicaid Other | Admitting: Certified Nurse Midwife

## 2017-04-28 ENCOUNTER — Encounter: Payer: Medicaid Other | Admitting: Certified Nurse Midwife

## 2017-04-29 ENCOUNTER — Inpatient Hospital Stay (HOSPITAL_COMMUNITY)
Admission: AD | Admit: 2017-04-29 | Discharge: 2017-05-02 | DRG: 807 | Disposition: A | Discharge status: Home / Self Care | Payer: Medicaid Other | Source: Ambulatory Visit | Attending: Obstetrics & Gynecology | Admitting: Obstetrics & Gynecology

## 2017-04-29 ENCOUNTER — Encounter: Payer: Self-pay | Admitting: Certified Nurse Midwife

## 2017-04-29 ENCOUNTER — Ambulatory Visit (INDEPENDENT_AMBULATORY_CARE_PROVIDER_SITE_OTHER): Payer: Medicaid Other | Admitting: Certified Nurse Midwife

## 2017-04-29 ENCOUNTER — Encounter (HOSPITAL_COMMUNITY): Payer: Self-pay | Admitting: *Deleted

## 2017-04-29 VITALS — BP 110/74 | HR 97 | Wt 252.4 lb

## 2017-04-29 DIAGNOSIS — O365990 Maternal care for other known or suspected poor fetal growth, unspecified trimester, not applicable or unspecified: Secondary | ICD-10-CM | POA: Diagnosis present

## 2017-04-29 DIAGNOSIS — IMO0002 Reserved for concepts with insufficient information to code with codable children: Secondary | ICD-10-CM | POA: Diagnosis present

## 2017-04-29 DIAGNOSIS — O358XX0 Maternal care for other (suspected) fetal abnormality and damage, not applicable or unspecified: Secondary | ICD-10-CM | POA: Diagnosis present

## 2017-04-29 DIAGNOSIS — O365930 Maternal care for other known or suspected poor fetal growth, third trimester, not applicable or unspecified: Principal | ICD-10-CM | POA: Diagnosis present

## 2017-04-29 DIAGNOSIS — O0990 Supervision of high risk pregnancy, unspecified, unspecified trimester: Secondary | ICD-10-CM

## 2017-04-29 DIAGNOSIS — Z3A39 39 weeks gestation of pregnancy: Secondary | ICD-10-CM

## 2017-04-29 DIAGNOSIS — O0993 Supervision of high risk pregnancy, unspecified, third trimester: Secondary | ICD-10-CM

## 2017-04-29 DIAGNOSIS — O24419 Gestational diabetes mellitus in pregnancy, unspecified control: Secondary | ICD-10-CM | POA: Diagnosis present

## 2017-04-29 DIAGNOSIS — O24420 Gestational diabetes mellitus in childbirth, diet controlled: Secondary | ICD-10-CM | POA: Diagnosis present

## 2017-04-29 DIAGNOSIS — O24429 Gestational diabetes mellitus in childbirth, unspecified control: Secondary | ICD-10-CM | POA: Diagnosis not present

## 2017-04-29 LAB — COMPREHENSIVE METABOLIC PANEL
ALBUMIN: 3.2 g/dL — AB (ref 3.5–5.0)
ALT: 15 U/L (ref 14–54)
AST: 19 U/L (ref 15–41)
Alkaline Phosphatase: 120 U/L (ref 38–126)
Anion gap: 10 (ref 5–15)
BILIRUBIN TOTAL: 0.7 mg/dL (ref 0.3–1.2)
BUN: 7 mg/dL (ref 6–20)
CHLORIDE: 105 mmol/L (ref 101–111)
CO2: 18 mmol/L — ABNORMAL LOW (ref 22–32)
Calcium: 9.1 mg/dL (ref 8.9–10.3)
Creatinine, Ser: 0.55 mg/dL (ref 0.44–1.00)
GFR calc Af Amer: >60 mL/min (ref >60)
GFR calc non Af Amer: >60 mL/min (ref >60)
GLUCOSE: 84 mg/dL (ref 65–99)
POTASSIUM: 3.9 mmol/L (ref 3.5–5.1)
SODIUM: 133 mmol/L — AB (ref 135–145)
TOTAL PROTEIN: 6.7 g/dL (ref 6.5–8.1)

## 2017-04-29 LAB — GROUP B STREP BY PCR: Group B strep by PCR: NEGATIVE

## 2017-04-29 LAB — OB RESULTS CONSOLE GC/CHLAMYDIA: Gonorrhea: NEGATIVE

## 2017-04-29 LAB — RAPID URINE DRUG SCREEN, HOSP PERFORMED
Amphetamines: NOT DETECTED
BENZODIAZEPINES: NOT DETECTED
Barbiturates: NOT DETECTED
Cocaine: NOT DETECTED
OPIATES: NOT DETECTED
Tetrahydrocannabinol: NOT DETECTED

## 2017-04-29 LAB — GLUCOSE, CAPILLARY
GLUCOSE-CAPILLARY: 107 mg/dL — AB (ref 65–99)
GLUCOSE-CAPILLARY: 82 mg/dL (ref 65–99)
GLUCOSE-CAPILLARY: 85 mg/dL (ref 65–99)

## 2017-04-29 LAB — CBC
HEMATOCRIT: 32.3 % — AB (ref 36.0–46.0)
Hemoglobin: 10.9 g/dL — ABNORMAL LOW (ref 12.0–15.0)
MCH: 25.8 pg — ABNORMAL LOW (ref 26.0–34.0)
MCHC: 33.7 g/dL (ref 30.0–36.0)
MCV: 76.5 fL — ABNORMAL LOW (ref 78.0–100.0)
PLATELETS: 364 10*3/uL (ref 150–400)
RBC: 4.22 MIL/uL (ref 3.87–5.11)
RDW: 13.7 % (ref 11.5–15.5)
WBC: 8.5 10*3/uL (ref 4.0–10.5)

## 2017-04-29 LAB — TYPE AND SCREEN
ABO/RH(D): O POS
Antibody Screen: NEGATIVE

## 2017-04-29 LAB — ABO/RH: ABO/RH(D): O POS

## 2017-04-29 LAB — OB RESULTS CONSOLE GBS: GBS: NEGATIVE

## 2017-04-29 MED ORDER — LIDOCAINE HCL (PF) 1 % IJ SOLN
30.0000 mL | INTRAMUSCULAR | Status: DC | PRN
  Filled 2017-04-29: qty 30

## 2017-04-29 MED ORDER — ACETAMINOPHEN 325 MG PO TABS
650.0000 mg | ORAL_TABLET | ORAL | Status: DC | PRN
  Administered 2017-04-30: 650 mg via ORAL
  Filled 2017-04-29: qty 2

## 2017-04-29 MED ORDER — OXYTOCIN 40 UNITS IN LACTATED RINGERS INFUSION - SIMPLE MED
2.5000 [IU]/h | INTRAVENOUS | Status: DC
  Administered 2017-04-30: 2.5 [IU]/h via INTRAVENOUS
  Filled 2017-04-29: qty 1000

## 2017-04-29 MED ORDER — OXYTOCIN 40 UNITS IN LACTATED RINGERS INFUSION - SIMPLE MED
1.0000 m[IU]/min | INTRAVENOUS | Status: DC
  Administered 2017-04-29: 2 m[IU]/min via INTRAVENOUS
  Administered 2017-04-30: 14 m[IU]/min via INTRAVENOUS

## 2017-04-29 MED ORDER — LACTATED RINGERS IV SOLN
500.0000 mL | INTRAVENOUS | Status: DC | PRN
  Administered 2017-04-30: 500 mL via INTRAVENOUS

## 2017-04-29 MED ORDER — ONDANSETRON HCL 4 MG/2ML IJ SOLN
4.0000 mg | Freq: Four times a day (QID) | INTRAMUSCULAR | Status: DC | PRN
  Administered 2017-04-29: 4 mg via INTRAVENOUS
  Filled 2017-04-29: qty 2

## 2017-04-29 MED ORDER — TERBUTALINE SULFATE 1 MG/ML IJ SOLN: 0.2500 mg | Freq: Once | INTRAMUSCULAR | Status: DC | PRN

## 2017-04-29 MED ORDER — FENTANYL CITRATE (PF) 100 MCG/2ML IJ SOLN
100.0000 ug | INTRAMUSCULAR | Status: DC | PRN
  Administered 2017-04-29 – 2017-04-30 (×8): 100 ug via INTRAVENOUS
  Filled 2017-04-29 (×8): qty 2

## 2017-04-29 MED ORDER — OXYTOCIN BOLUS FROM INFUSION
500.0000 mL | Freq: Once | INTRAVENOUS | Status: AC
  Administered 2017-04-30: 500 mL via INTRAVENOUS

## 2017-04-29 MED ORDER — OXYTOCIN 10 UNIT/ML IJ SOLN: 10.0000 [IU] | Freq: Once | INTRAMUSCULAR | Status: DC

## 2017-04-29 MED ORDER — SOD CITRATE-CITRIC ACID 500-334 MG/5ML PO SOLN: 30.0000 mL | ORAL | Status: DC | PRN

## 2017-04-29 MED ORDER — MISOPROSTOL 25 MCG QUARTER TABLET
25.0000 ug | ORAL_TABLET | ORAL | Status: DC | PRN
  Administered 2017-04-29: 25 ug via VAGINAL
  Filled 2017-04-29: qty 1

## 2017-04-29 MED ORDER — MISOPROSTOL 50MCG HALF TABLET
50.0000 ug | ORAL_TABLET | ORAL | Status: DC | PRN
  Administered 2017-04-29: 50 ug via BUCCAL
  Filled 2017-04-29 (×2): qty 1

## 2017-04-29 MED ORDER — LACTATED RINGERS IV SOLN
INTRAVENOUS | Status: DC
  Administered 2017-04-29 – 2017-04-30 (×4): via INTRAVENOUS

## 2017-04-29 MED ORDER — TERBUTALINE SULFATE 1 MG/ML IJ SOLN
0.2500 mg | Freq: Once | INTRAMUSCULAR | Status: DC | PRN
  Filled 2017-04-29: qty 1

## 2017-04-29 NOTE — Anesthesia Pain Management Evaluation Note (Signed)
  CRNA Pain Management Visit Note  Patient: Anne Jensen, 22 y.o., female  "Hello I am a member of the anesthesia team at [facility]. We have an anesthesia team available at all times to provide care throughout the hospital, including epidural management and anesthesia for C-section. I don't know your plan for the delivery whether it a natural birth, water birth, IV sedation, nitrous supplementation, doula or epidural, but we want to meet your pain goals."   1.Was your pain managed to your expectations on prior hospitalizations?   No prior hospitalizations  2.What is your expectation for pain management during this hospitalization?     Epidural and IV pain meds  3.How can we help you reach that goal? Pt has had limited prenatal care but was open to discussion about pain control methods. All questions answered.  Record the patient's initial score and the patient's pain goal.   Pain: 3  Pain Goal: 5 [facility] wants you to be able to say your pain was always managed very well.  Anne Jensen 04/29/2017

## 2017-04-29 NOTE — Progress Notes (Signed)
Patient ID: Anne Jensen, female   DOB: Feb 04, 1996, 22 y.o.   MRN: [id number] Anne Jensen is a 22 y.o. G1P0000 at 6069w4d admitted for induction of labor due to A1DM, IUGR.  Subjective: Comfortable, no complaints  Objective: BP 112/77   Pulse 85   Temp 98.1 F (36.7 C)   Resp 18   Ht 5\' 6"  (1.676 m)   Wt 114.5 kg (252 lb 6.4 oz)   LMP 07/26/2016 (Exact Date)   BMI 40.74 kg/m  No intake/output data recorded.  FHT:  FHR: 135 bpm, variability: moderate,  accelerations:  Present,  decelerations:  Absent UC:   q 2-254mins  SVE:   Dilation: 4.5 Effacement (%): 50 Station: -3 Exam by:: Sade Harper,RN  Foley bulb fell out at app 2220 and pitocin starated at 2236 Pitocin @ 2 mu/min  Labs: Lab Results  Component Value Date   WBC 8.5 04/29/2017   HGB 10.9 (L) 04/29/2017   HCT 32.3 (L) 04/29/2017   MCV 76.5 (L) 04/29/2017   PLT 364 04/29/2017   Assessment / Plan: IOL d/t A1DM, IUGR, s/p cytotec x 2, foley bulb now out, pitocin per protocol  Labor: s/p cervical ripening Fetal Wellbeing:  Category I Pain Control:  n/a Pre-eclampsia: n/a I/D:  n/a Anticipated MOD:  NSVD  Cheral MarkerKimberly R Jevin Camino CNM, WHNP-BC 04/29/2017, 11:22 PM

## 2017-04-29 NOTE — Progress Notes (Signed)
Patient ID: Anne Jensen, female   DOB: 1995-12-19, 22 y.o.   MRN: [id number] Anne Jensen is a 22 y.o. G1P0000 at 7054w4d admitted for induction of labor due to A1DM, IUGR.  Subjective: Comfortable, no complaints  Objective: BP 112/77   Pulse 85   Temp 98.1 F (36.7 C)   Resp 18   Ht 5\' 6"  (1.676 m)   Wt 114.5 kg (252 lb 6.4 oz)   LMP 07/26/2016 (Exact Date)   BMI 40.74 kg/m  No intake/output data recorded.  FHT:  FHR: 125 bpm, variability: moderate,  accelerations:  Present,  decelerations:  Absent UC:   q 2-335mins  SVE:   Dilation: 1.5 Effacement (%): Thick Station: -3 Exam by:: Dr. Linwood Dibblesumball @ 1423  CBG (last 3)  Recent Labs    04/29/17 1215 04/29/17 1603  GLUCAP 82 85      Labs: Lab Results  Component Value Date   WBC 8.5 04/29/2017   HGB 10.9 (L) 04/29/2017   HCT 32.3 (L) 04/29/2017   MCV 76.5 (L) 04/29/2017   PLT 364 04/29/2017    Assessment / Plan: IOL d/t A1DM, IUGR, has received cytotec x 2, last dose @ 1718, foley bulb placed around 1400, plan pitocin per protocol once it falls out  Labor: cervical ripening Fetal Wellbeing:  Category I Pain Control:  n/a Pre-eclampsia: n/a I/D:  n/a Anticipated MOD:  NSVD  Cheral MarkerKimberly R Shuntel Fishburn CNM, WHNP-BC 04/29/2017, 9:29 PM

## 2017-04-29 NOTE — Progress Notes (Signed)
Pt would like to discuss birth plan.   

## 2017-04-29 NOTE — Progress Notes (Signed)
   PRENATAL VISIT NOTE  Subjective:  Anne Jensen is a 22 y.o. G1P0000 at 6832w4d being seen today for ongoing prenatal care.  She is currently monitored for the following issues for this high-risk pregnancy and has Supervision of high risk pregnancy, antepartum; Asthma; Club foot; Gestational diabetes mellitus; Poor fetal growth; and IUGR (intrauterine growth restriction) affecting care of mother on their problem list.  Patient reports no complaints.  Contractions: Irregular. Vag. Bleeding: None.  Movement: Present. Denies leaking of fluid.   The following portions of the patient's history were reviewed and updated as appropriate: allergies, current medications, past family history, past medical history, past social history, past surgical history and problem list. Problem list updated.  Objective:   Vitals:   04/29/17 0950  BP: 110/74  Pulse: 97  Weight: 252 lb 6.4 oz (114.5 kg)    Fetal Status:     Movement: Present     General:  Alert, oriented and cooperative. Patient is in no acute distress.  Skin: Skin is warm and dry. No rash noted.   Cardiovascular: Normal heart rate noted  Respiratory: Normal respiratory effort, no problems with respiration noted  Abdomen: Soft, gravid, appropriate for gestational age.  Pain/Pressure: Present     Pelvic: Cervical exam deferred        Extremities: Normal range of motion.  Edema: None  Mental Status:  Normal mood and affect. Normal behavior. Normal judgment and thought content.  NST: + accels, 1 decels, moderate variability, Cat. 2 tracing. No contractions on toco.   Assessment and Plan:  Pregnancy: G1P0000 at 2932w4d  1. Supervision of high risk pregnancy, antepartum    Non-reactive NST, one variable.  Unknown blood sugars has not been checking them.  Told nurse that she wanted a waterbirth.  Has not picked out pediatrician.  Has not been to the office since NOB visit at 30 weeks.  Seemed unaware of due date or anything related to this  pregnancy.  Did not do childbirth classes.  [facility]  Recommended delivery at 39 weeks. Report called to Dr. Adrian BlackwaterStinson for direct admit.   2. Gestational diabetes mellitus (GDM) in third trimester, gestational diabetes method of control unspecified    Medical non-compliance.   - Fetal nonstress test; Future  Term labor symptoms and general obstetric precautions including but not limited to vaginal bleeding, contractions, leaking of fluid and fetal movement were reviewed in detail with the patient. Please refer to After Visit Summary for other counseling recommendations.  Return in about 4 weeks (around 05/27/2017) for Postpartum.   Roe Coombsachelle A Paizley Ramella, CNM

## 2017-04-29 NOTE — H&P (Addendum)
OBSTETRIC ADMISSION HISTORY AND PHYSICAL  Anne Jensen is a 22 y.o. female G1P0000 with IUP at 31w4dby LMP presenting for IOL for IUGR. She reports +FMs, No LOF, no VB, no blurry vision, headaches or peripheral edema, no RUQ pain. No contractions at this time. She plans on bottle and breast feeding. She is undecided on birth control method. [facility]   Dating: By LMP --->  Estimated Date of Delivery: 05/02/17  Sono:    '@[redacted]w[redacted]d'$ , CWD, right club foot, cephalic presentation, anterior lie-above cervical os, 1725g, <10%% EFW   Prenatal History/Complications:  Past Medical History: Past Medical History:  Diagnosis Date  . Asthma   . Endometriosis   . Supervision of other normal pregnancy, antepartum 02/26/2017    [facility]  [place]  [place] Blood type: O/Positive/-- (12/26 1706) o pos Genetic Screen 1 Screen:    AFP:     Quad:     NIPS: Antibody:Negative (12/26 1706)neg Anatomic [place] Rubella: 1.32 (12/26 1706)imm GTT 1 of 3 2-hr values elevated RPR: Non Reactive (01/02 1214) neg Flu vaccine   03/2017 at pharmacy HBsAg: Negative (12/26 1706) neg TDaP vaccine                                       Past Surgical History: Past Surgical History:  Procedure Laterality Date  . NO PAST SURGERIES      Obstetrical History: OB History    Gravida Para Term Preterm AB Living   1 0 0 0 0 0   SAB TAB Ectopic Multiple Live Births   0 0 0 0        Social History: Social History   Socioeconomic History  . Marital status: Single    Spouse name: None  . Number of children: None  . Years of education: None  . Highest education level: None  Social Needs  . Financial resource strain: None  . Food insecurity - worry: None  . Food insecurity - inability: None  . Transportation needs - medical: None  . Transportation needs - non-medical: None  Occupational History  . None  Tobacco Use  . Smoking status: Never Smoker  . Smokeless  tobacco: Never Used  Substance and Sexual Activity  . Alcohol use: No  . Drug use: No  . Sexual activity: Yes    Birth control/protection: None  Other Topics Concern  . None  Social History Narrative  . None    Family History: History reviewed. No pertinent family history.  Allergies: Allergies  Allergen Reactions  . Morphine And Related Other (See Comments)    Makes skin burn    Medications Prior to Admission  Medication Sig Dispense Refill Last Dose  . Blood Glucose Monitoring Suppl (ACCU-CHEK GUIDE) w/Device KIT 1 kit by Does not apply route 4 (four) times daily. (Patient not taking: Reported on 04/22/2017) 1 kit 0 Not Taking  . ferrous sulfate 325 (65 FE) MG tablet Take 1 tablet (325 mg total) by mouth daily with breakfast. 60 tablet 11 Taking  . glucose blood (ACCU-CHEK GUIDE) test strip Use as instructed (Patient not taking: Reported on 04/22/2017) 100 each 12 Not Taking  . Prenatal Vit-Fe Fumarate-FA (PRENATAL MULTIVITAMIN) TABS tablet Take 1 tablet by mouth daily at 12 noon.   Taking     Review of Systems   All systems reviewed and  negative except as stated in HPI  Blood pressure 119/71, pulse 96, temperature 98.6 F (37 C), temperature source Oral, resp. rate 18, height '5\' 6"'$  (1.676 m), weight 114.5 kg (252 lb 6.4 oz), last menstrual period 07/26/2016. General appearance: alert, cooperative and appears stated age Lungs: clear to auscultation bilaterally Heart: regular rate and rhythm Abdomen: soft, non-tender; bowel sounds normal Pelvic: will be performed by midwife Extremities: Homans sign is negative, no sign of DVT Presentation: cephalic Fetal monitoring Baseline: 140 bpm, Variability: Good {> 6 bpm), Accelerations: Reactive and Decelerations: rare variable with quick return to baseline Uterine activity: None     Prenatal labs: ABO, Rh: O/Positive/-- (12/26 1706) Antibody: Negative (12/26 1706) Rubella: 1.32 (12/26 1706) RPR: Non Reactive (01/02 1214)   HBsAg: Negative (12/26 1706)  HIV: Non Reactive (01/02 1214)  GBS:   unkown 2 hr Glucola: 159  Genetic screening: did not receive Anatomy US: Rt club foot, otherwise normal, EFW <10th percentile   Prenatal Transfer Tool  Maternal Diabetes: Yes:  Diabetes Type:  Diet controlled Genetic Screening: Declined Maternal Ultrasounds/Referrals: Abnormal:  Findings:   Other: Right club foot Fetal Ultrasounds or other Referrals:  None Maternal Substance Abuse:  No Significant Maternal Medications:  None Significant Maternal Lab Results: Lab values include: Group B Strep negative  No results found for this or any previous visit (from the past 24 hour(s)).  Patient Active Problem List   Diagnosis Date Noted  . IUGR (intrauterine growth restriction) affecting care of mother 04/29/2017  . Poor fetal growth 03/25/2017  . Asthma 03/12/2017  . Club foot 03/12/2017  . Gestational diabetes mellitus 03/12/2017  . Supervision of high risk pregnancy, antepartum 02/26/2017    Assessment/Plan:  Anne Jensen is a 22 y.o. G1P0000 at 40w4dhere for IOL for IUGR  #Labor: induction #Pain: Per pt request, does not want epidural at this time #FWB: Cat I #ID:  Unknown GBS status #MOF: bottle + breast #MOC: undecided #Circ:  N/A   Anne Jensen Student-PA  04/29/2017, 12:12 PM  CNM attestation:  I have seen and examined this patient; I agree with above documentation in the PA student's note.   Anne Jensen is a 22y.o. G1P0000 here for IOL due to IUGR. She had prenatal visits x 2 at 30wks and today, with IUGR dx at 34.4wks when growth was <10% and all biometry <3%. Cord dopplers remained nl. The plan had been made for IOL at 39wks. Baby also noted to have R club foot for which pt elected to have ped ortho consult after delivery.  PE: BP 118/69   Pulse 80   Temp 98.2 F (36.8 C) (Axillary)   Resp 18   Ht '5\' 6"'$  (1.676 m)   Wt 114.5 kg (252 lb 6.4 oz)   LMP 07/26/2016 (Exact Date)   BMI  40.74 kg/m  Gen: calm comfortable, NAD Resp: normal effort, no distress Abd: gravid  ROS, labs, PMH reviewed  Plan: Ad[facility] to [facility]/cervical foley for ripening, then Pit/AROM prn GBS PCR neg Anticipate SVD  Imogine Carvell CNM 04/29/2017, 4:47 PM

## 2017-04-29 NOTE — Progress Notes (Signed)
Labor Progress Note Anne Jensen is a 22 y.o. G1P0000 at 4197w4d presented for IOL due to IUGR and A1GDM S: patient is doing well but seems somewhat anxious. Has questions about plans going forward, but responds well to reassurance.   O:  BP 118/69   Pulse 80   Temp 98.2 F (36.8 C) (Axillary)   Resp 18   Ht 5\' 6"  (1.676 m)   Wt 114.5 kg (252 lb 6.4 oz)   LMP 07/26/2016 (Exact Date)   BMI 40.74 kg/m  FHT: 145 baseline/moderate variability/reactive accels/no decels  Foley bulb placed by Philipp DeputyKim Shaw, CNM after lengthy discussion with patient regarding indication, benefit and risks. Patient verbalized understanding and verbally consented before placement. Patient tolerated well and was in minimal discomfort at end of procedure. No questions at this time.   CVE: Dilation: 1.5 Effacement (%): Thick Cervical Position: Posterior Station: -3 Presentation: Vertex Exam by:: Dr. Linwood Dibblesumball and K. Clelia CroftShaw, CNM   A&P: 22 y.o. G1P0000 4897w4d here for IOL due to IUGR #Labor: Progressing Slowly.  Cytotec given, will reassess need for Pitocin once foley bulb spontaneously dislodges.  #Pain: tolerating pain well without medication. #FWB: Cat I #GBS negative #A1GDM: carb modified diet ordered.    Claudine Moutonyler Jisselle Poth, Student-PA 4:16 PM

## 2017-04-30 ENCOUNTER — Encounter (HOSPITAL_COMMUNITY): Payer: Self-pay

## 2017-04-30 DIAGNOSIS — Z3A39 39 weeks gestation of pregnancy: Secondary | ICD-10-CM

## 2017-04-30 DIAGNOSIS — O24429 Gestational diabetes mellitus in childbirth, unspecified control: Secondary | ICD-10-CM

## 2017-04-30 DIAGNOSIS — O365930 Maternal care for other known or suspected poor fetal growth, third trimester, not applicable or unspecified: Secondary | ICD-10-CM

## 2017-04-30 LAB — GLUCOSE, CAPILLARY
GLUCOSE-CAPILLARY: 84 mg/dL (ref 65–99)
GLUCOSE-CAPILLARY: 85 mg/dL (ref 65–99)
GLUCOSE-CAPILLARY: 83 mg/dL (ref 65–99)
GLUCOSE-CAPILLARY: 110 mg/dL — AB (ref 65–99)
GLUCOSE-CAPILLARY: 118 mg/dL — AB (ref 65–99)
Glucose-Capillary: 96 mg/dL (ref 65–99)

## 2017-04-30 LAB — RPR: RPR Ser Ql: NONREACTIVE

## 2017-04-30 LAB — GC/CHLAMYDIA PROBE AMP (~~LOC~~) NOT AT ARMC
CHLAMYDIA, DNA PROBE: NEGATIVE
NEISSERIA GONORRHEA: NEGATIVE

## 2017-04-30 MED ORDER — TETANUS-DIPHTH-ACELL PERTUSSIS 5-2.5-18.5 LF-MCG/0.5 IM SUSP: 0.5000 mL | Freq: Once | INTRAMUSCULAR | Status: DC

## 2017-04-30 MED ORDER — COCONUT OIL OIL: 1.0000 "application " | TOPICAL_OIL | Status: DC | PRN

## 2017-04-30 MED ORDER — ONDANSETRON HCL 4 MG/2ML IJ SOLN: 4.0000 mg | INTRAMUSCULAR | Status: DC | PRN

## 2017-04-30 MED ORDER — DIBUCAINE 1 % RE OINT: 1.0000 "application " | TOPICAL_OINTMENT | RECTAL | Status: DC | PRN

## 2017-04-30 MED ORDER — ONDANSETRON HCL 4 MG PO TABS: 4.0000 mg | ORAL_TABLET | ORAL | Status: DC | PRN

## 2017-04-30 MED ORDER — SIMETHICONE 80 MG PO CHEW: 80.0000 mg | CHEWABLE_TABLET | ORAL | Status: DC | PRN

## 2017-04-30 MED ORDER — ACETAMINOPHEN 325 MG PO TABS: 650.0000 mg | ORAL_TABLET | ORAL | Status: DC | PRN

## 2017-04-30 MED ORDER — ZOLPIDEM TARTRATE 5 MG PO TABS: 5.0000 mg | ORAL_TABLET | Freq: Every evening | ORAL | Status: DC | PRN

## 2017-04-30 MED ORDER — DIPHENHYDRAMINE HCL 25 MG PO CAPS: 25.0000 mg | ORAL_CAPSULE | Freq: Four times a day (QID) | ORAL | Status: DC | PRN

## 2017-04-30 MED ORDER — WITCH HAZEL-GLYCERIN EX PADS: 1.0000 "application " | MEDICATED_PAD | CUTANEOUS | Status: DC | PRN

## 2017-04-30 MED ORDER — SENNOSIDES-DOCUSATE SODIUM 8.6-50 MG PO TABS
2.0000 | ORAL_TABLET | ORAL | Status: DC
  Administered 2017-04-30: 2 via ORAL
  Filled 2017-04-30 (×2): qty 2

## 2017-04-30 MED ORDER — BENZOCAINE-MENTHOL 20-0.5 % EX AERO: 1.0000 "application " | INHALATION_SPRAY | CUTANEOUS | Status: DC | PRN

## 2017-04-30 MED ORDER — IBUPROFEN 600 MG PO TABS
600.0000 mg | ORAL_TABLET | Freq: Four times a day (QID) | ORAL | Status: DC
  Administered 2017-04-30 – 2017-05-02 (×6): 600 mg via ORAL
  Filled 2017-04-30 (×7): qty 1

## 2017-04-30 MED ORDER — PRENATAL MULTIVITAMIN CH
1.0000 | ORAL_TABLET | Freq: Every day | ORAL | Status: DC
  Administered 2017-05-01 – 2017-05-02 (×2): 1 via ORAL
  Filled 2017-04-30 (×2): qty 1

## 2017-04-30 NOTE — Progress Notes (Signed)
Patient ID: Anne Jensen, female   DOB: [birth year], 22 y.o.   MRN: [id number] Anne Jensen is a 22 y.o. G1P0000 at 5467w5d admitted for induction of labor due to A1DM, IUGR.  Subjective: Beginning to feel some discomfort w/ uc's  Objective: BP (!) 101/52   Pulse 94   Temp 98.5 F (36.9 C) (Oral)   Resp 16   Ht 5\' 6"  (1.676 m)   Wt 114.5 kg (252 lb 6.4 oz)   LMP 07/26/2016 (Exact Date)   BMI 40.74 kg/m  No intake/output data recorded.  FHT:  FHR: 125 bpm, variability: moderate,  accelerations:  Present,  decelerations:  Absent UC:   regular, every 2-4 minutes  SVE:   5-6/80/-2, vtx  AROM sm amt clear fluid Pitocin @ 10 mu/min  Labs: Lab Results  Component Value Date   WBC 8.5 04/29/2017   HGB 10.9 (L) 04/29/2017   HCT 32.3 (L) 04/29/2017   MCV 76.5 (L) 04/29/2017   PLT 364 04/29/2017   CBG (last 3)  Recent Labs    04/30/17 0216 04/30/17 0432 04/30/17 0633  GLUCAP 84 85 83    Assessment / Plan: IOL d/t A1DM, IUGR, now AROM'd, pitocin per protocol  Labor: Progressing normally Fetal Wellbeing:  Category I Pain Control:  wanting iv pain meds, plans epidural Pre-eclampsia: n/a I/D:  n/a Anticipated MOD:  NSVD  Cheral MarkerKimberly R Ivelisse Culverhouse CNM, WHNP-BC 04/30/2017, 8:04 AM

## 2017-04-30 NOTE — Progress Notes (Signed)
Labor Progress Note Anne Jensen is a 22 y.o. G1P0000 at 6262w5d presented for IOL For A1GDM, IUGR  S:  Patient reporting worsening pressure  O:  BP (!) 94/47   Pulse (!) 103   Temp 98.1 F (36.7 C) (Oral)   Resp 18   Ht 5\' 6"  (1.676 m)   Wt 252 lb 6.4 oz (114.5 kg)   LMP 07/26/2016 (Exact Date)   BMI 40.74 kg/m   Fetal Tracing:  Baseline: 135 Variability: moderate Accels: 15x15 Decels: none  Toco: 1-3   CVE: Dilation: 8 Effacement (%): 90 Cervical Position: Middle Station: 0 Presentation: Vertex Exam by:: Ma Hillock. Cosima Prentiss CNM   A&P: 22 y.o. G1P0000 4162w5d IOL for A1GDM, IUGR #Labor: Progressing well. Continue pitocin. Anticipate NSVD soon #Pain: IV pain medication #FWB: Cat 1 #GBS negative  Rolm Bookbinderaroline M Lolita Faulds, CNM 6:56 PM

## 2017-04-30 NOTE — Progress Notes (Signed)
Patient ID: Anne Jensen, female   DOB: 14-Aug-1995, 22 y.o.   MRN: [id number] Anne Jensen is a 22 y.o. G1P0000 at 6287w5d admitted for induction of labor due to A1DM, IUGR.  Subjective: Breathing through contractions. Difficulty tracking FHR and contractions with external monitors  Objective: BP 134/81   Pulse 95   Temp 98.5 F (36.9 C) (Oral)   Resp 16   Ht 5\' 6"  (1.676 m)   Wt 252 lb 6.4 oz (114.5 kg)   LMP 07/26/2016 (Exact Date)   BMI 40.74 kg/m  No intake/output data recorded.  FHT:  FHR: 140bpm, variability: moderate,  accelerations:  Present,  decelerations:  Absent UC:   regular, every 2min  SVE:   Dilation: 5.5 Effacement (%): 80 Cervical Position: Middle Station: -2 Presentation: Vertex Exam by:: Anne SafeKimberly Rosealee Recinos MD  FSE and IUPC placed Pitocin @ 10 mu/min  Labs: Lab Results  Component Value Date   WBC 8.5 04/29/2017   HGB 10.9 (L) 04/29/2017   HCT 32.3 (L) 04/29/2017   MCV 76.5 (L) 04/29/2017   PLT 364 04/29/2017   CBG (last 3)  Recent Labs    04/30/17 0216 04/30/17 0432 04/30/17 0633  GLUCAP 84 85 83    Assessment / Plan: IOL d/t A1DM, IUGR, now AROM'd, pitocin per protocol  Labor: Progressing. IUPC and FSE placed for monitoring. Will use the IUPC to titrate pitocin for adequate Fetal Wellbeing:  Category I Pain Control:  Coping well currently  Pre-eclampsia: n/a I/D:  n/a Anticipated MOD:  NSVD  Anne FlakeKimberly Niles Joni Jensen 04/30/2017, 10:02 AM

## 2017-04-30 NOTE — Progress Notes (Signed)
Labor Progress Note [name] is a 22 y.o. G1P0000 at 219w5d presented for IOL for A1GDM, IUGR  S:  Patient reporting pressure, worsening contractions  O:  BP (!) 107/56   Pulse 82   Temp 99 F (37.2 C)   Resp 20   Ht 5\' 6"  (1.676 m)   Wt 252 lb 6.4 oz (114.5 kg)   LMP 07/26/2016 (Exact Date)   BMI 40.74 kg/m   Fetal Tracing:  Baseline: 135 Variability: moderate Accels: 15x15 Decels: none  Toco: 1-3   CVE: Dilation: 6 Effacement (%): 80 Cervical Position: Middle Station: -2 Presentation: Vertex Exam by:: brewer cnm   A&P: 22 y.o. G1P0000 429w5d IOL for A1GDM, IUGR #Labor: Continue pitocin #Pain: IV pain medication #FWB: Cat 1 #GBS negative   Rolm Bookbinderaroline M Neill, CNM 1:44 PM

## 2017-04-30 NOTE — Progress Notes (Signed)
Anne Jensen is a 22 y.o. G1P0000 at 495w5d admitted for induction of labor due to Gestational diabetes.  Subjective: Patient sleeping, easily arousable. Comfortable without epidural.   Objective: BP (!) 109/49   Pulse 89   Temp 98.8 F (37.1 C)   Resp 16   Ht 5\' 6"  (1.676 m)   Wt 114.5 kg (252 lb 6.4 oz)   LMP 07/26/2016 (Exact Date)   BMI 40.74 kg/m  No intake/output data recorded. No intake/output data recorded.  FHT:  FHR: 145 bpm, variability: moderate,  accelerations:  Present,  decelerations:  Absent UC:   irregular, every 5-7 minutes SVE:   Dilation: 4.5 Effacement (%): 50 Station: -3 Exam by:: Sade Harper,RN  Labs: Lab Results  Component Value Date   WBC 8.5 04/29/2017   HGB 10.9 (L) 04/29/2017   HCT 32.3 (L) 04/29/2017   MCV 76.5 (L) 04/29/2017   PLT 364 04/29/2017    Assessment / Plan: Induction of labor due to gestational diabetes,  progressing well on pitocin  Labor: Progressing on Pitocin, will continue to increase then AROM Preeclampsia:  no signs or symptoms of toxicity Fetal Wellbeing:  Category I Pain Control:  IV pain meds I/D:  n/a Anticipated MOD:  NSVD  Anne Jensen 04/30/2017, 2:16 AM

## 2017-04-30 NOTE — Progress Notes (Signed)
Labor Progress Note Anne Jensen is a 22 y.o. G1P0000 at 2523w5d presented for IOL for A1GDM, IUGR  S:  Patient still reporting pressure, using IV pain medication  O:  BP 125/81   Pulse (!) 115   Temp 98.4 F (36.9 C) (Oral)   Resp 18   Ht 5\' 6"  (1.676 m)   Wt 252 lb 6.4 oz (114.5 kg)   LMP 07/26/2016 (Exact Date)   BMI 40.74 kg/m    Fetal Tracing:  Baseline: 135 Variability: moderate Accels: 15x15 Decels: variable/early  Toco: 1-3   CVE: Dilation: 6 Effacement (%): 80 Cervical Position: Middle Station: -2, -1 Presentation: Vertex Exam by:: Anne Plateravis, RN   A&P: 22 y.o. G1P0000 4323w5d IOL for A1GDM, IUGR #Labor: Continue pitocin until consistently adequate contractions. Has periods where adequate and then spaces out. #Pain: Encouraged patient to consider epidural for pain relief, patient only desires IV meds even with minimal relief #FWB: Cat 1 #GBS negative  Anne Jensen, CNM 4:34 PM

## 2017-05-01 LAB — CBC
HCT: 28.6 % — ABNORMAL LOW (ref 36.0–46.0)
Hemoglobin: 9.8 g/dL — ABNORMAL LOW (ref 12.0–15.0)
MCH: 26 pg (ref 26.0–34.0)
MCHC: 34.3 g/dL (ref 30.0–36.0)
MCV: 75.9 fL — ABNORMAL LOW (ref 78.0–100.0)
PLATELETS: 365 10*3/uL (ref 150–400)
RBC: 3.77 MIL/uL — ABNORMAL LOW (ref 3.87–5.11)
RDW: 13.6 % (ref 11.5–15.5)
WBC: 25.6 10*3/uL — AB (ref 4.0–10.5)

## 2017-05-01 LAB — GLUCOSE, CAPILLARY: Glucose-Capillary: 90 mg/dL (ref 65–99)

## 2017-05-01 NOTE — Progress Notes (Addendum)
Post Partum Day 1 Subjective: no complaints, up ad lib, voiding and tolerating PO  Objective: Blood pressure 110/65, pulse 84, temperature 98.6 F (37 C), resp. rate 16, height 5\' 6"  (1.676 m), weight 252 lb 6.4 oz (114.5 kg), last menstrual period 07/26/2016, SpO2 100 %.  Physical Exam:  General: alert, cooperative and no distress Lochia: appropriate Uterine Fundus: firm Incision: n/a DVT Evaluation: No evidence of DVT seen on physical exam.  Recent Labs    04/29/17 1158 05/01/17 0441  HGB 10.9* 9.8*  HCT 32.3* 28.6*    Assessment/Plan: Plan for discharge tomorrow and Breastfeeding Seemed surprised we would plan her discharge for tomorrow Reviewed since she didn't deliver until last night, baby needs to be 24 hours old to go home   LOS: 2 days   Wynelle BourgeoisMarie Roxas Clymer 05/01/2017, 5:53 AM

## 2017-05-01 NOTE — Lactation Note (Addendum)
This note was copied from a baby's chart. Lactation Consultation Note Baby 5 hrs old. Mom BF in cradle position. Mom has LARGE soft breast. Baby swaddled in blanket, face down into breast.  Repositioned baby, discussed not having breast over face. Assisted obtaining deeper latch. Baby kept pulling on tissue d/t mom not having baby close enough. Hard for mom to get a medium not to deep not to far into breast. Mom finally got it. Discussed feeding position options, support, hand expression, STS, I&O, body alignment of baby, cluster feeding, and assessing breast for transfer. Mom has FLAT compressible nipples. Baby was able to obtain deep latch. Shells given and hand pump to pre-pump to evert nipples before latching.  Encouraged to call for assistance if needed. Answered questions.  [facility]/LC brochure given w/resources, support groups and LC services.  Patient Name: Anne Jensen Today's Date: 05/01/2017 Reason for consult: Initial assessment   Maternal Data Has patient been taught Hand Expression?: Yes Does the patient have breastfeeding experience prior to this delivery?: No  Feeding Feeding Type: Breast Fed Length of feed: 25 min(still BF)  LATCH Score Latch: Grasps breast easily, tongue down, lips flanged, rhythmical sucking.  Audible Swallowing: Spontaneous and intermittent  Type of Nipple: Flat  Comfort (Breast/Nipple): Soft / non-tender  Hold (Positioning): Assistance needed to correctly position infant at breast and maintain latch.  LATCH Score: 8  Interventions Interventions: Breast feeding basics reviewed;Assisted with latch;Breast compression;Shells;Skin to skin;Adjust position;Breast massage;Support pillows;Hand pump;Hand express;Position options;Pre-pump if needed  Lactation Tools Discussed/Used Tools: Shells;Pump Shell Type: Inverted Breast pump type: Manual WIC Program: Yes Pump Review: Setup, frequency, and cleaning;Milk Storage Initiated by:: Peri JeffersonL. Deborra Phegley RN  IBCLC Date initiated:: 05/01/17   Consult Status Consult Status: Follow-up Date: 05/02/17 Follow-up type: In-patient    Garvis Downum, Diamond NickelLAURA G 05/01/2017, 1:36 AM

## 2017-05-01 NOTE — Lactation Note (Signed)
This note was copied from a baby's chart. Lactation Consultation Note  Patient Name: Anne Jensen Today's Date: 05/01/2017 Reason for consult: Follow-up assessment  Mom reports that nursing is going well. She feels like her breasts are a little bit heavier & that she is noticing a difference in the color of her colostrum/milk. Mom feel that infant is swallowing. She has no complaints about feeding & denies further questions.  Mom does have WIC. Mom has been provided a hand pump.   Anne Jensen, [facility] 05/01/2017, 10:38 PM

## 2017-05-02 ENCOUNTER — Encounter (HOSPITAL_COMMUNITY): Payer: Self-pay | Admitting: *Deleted

## 2017-05-02 MED ORDER — IBUPROFEN 600 MG PO TABS: 600.0000 mg | ORAL_TABLET | Freq: Four times a day (QID) | ORAL | 0 refills | Status: DC

## 2017-05-02 NOTE — Clinical Social Work Maternal (Signed)
CLINICAL SOCIAL WORK MATERNAL/CHILD NOTE  Patient Details  Name: Anne Jensen MRN: [id number] Date of Birth: 04/30/2019  Date:  05/02/2017  Clinical Social Worker Initiating Note:  Ithan Touhey Boyd-Gilyard Date/Time: Initiated:  05/09/17/1444     Child's Name:  Anne Jensen   Biological Parents:  Mother, Father(FOB is Luvan Jensen 02/10/1982)   Need for Interpreter:  None   Reason for Referral:  Late or No Prenatal Care    Address:  [address] [place] [address]    Phone number:  [phone] (home)     Additional phone number:   Household Members/Support Persons (HM/SP):   (MOB resides with MOB's mother and sister (MOB's sister has 2 children that also lives in the home).)   HM/SP Name Relationship DOB or Age  HM/SP -1        HM/SP -2        HM/SP -3        HM/SP -4        HM/SP -5        HM/SP -6        HM/SP -7        HM/SP -8          Natural Supports (not living in the home):  Spouse/significant other, Friends, Extended Family, Immediate Family   Professional Supports: None   Employment: Unemployed   Type of Work:     Education:  High school graduate   Homebound arranged:    Financial Resources:  Medicaid   Other Resources:  Food Stamps , WIC   Cultural/Religious Considerations Which May Impact Care:  Per MOB's Face Sheet, MOB is Non-Denominational.  Strengths:  Ability to meet basic needs , Home prepared for child , Pediatrician chosen   Psychotropic Medications:         Pediatrician:    [place]  Pediatrician List:   [facility]  [place]    [place]    [place]    [place]    [place]      Pediatrician Fax Number:    Risk Factors/Current Problems:  None   Cognitive State:  Able to Concentrate , Alert , Linear Thinking    Mood/Affect:  Calm , Relaxed , Comfortable , Interested    CSW Assessment: CSW met with MOB in room 144.  When CSW  arrived, MOB, FOB, and MOB's mother were preparing for MOB's and infant's discharge.  When MOB's permission, CSW asked FOB and MOB's mother to leave the room in order to meet with MOB in private.  FOB appeared hesitant and repeatedly entered the room throughout CSW time completing the assessment.  CSW MOB continuously informed him that CSW needed to meet with MOB in private. MOB was polite and receptive to meeting with CSW.    CSW asked about MOB's late/limited PNC.  MOB shared that shortly after MOB's pregnancy confirmation, MOB moved from [place] to  and was unable to find a provider. MOB reported visiting MAU often and communicated, "That should count for prenatal appointments."  CSW made MOB aware that MAU visit does not count for PNC visits however, it good that MOB was seen if the MAU when she had concerns.  MOB denied any barriers with MOB following up with infant's well baby visits. MOB also reported that MOB has all necessary items needed to parent infant (CSW observed a new car seat in MOB's room).   CSW made MOB aware of hospital's prenatal   care policy.  MOB asked MOB why did MOB removed cotton balls from infant's diaper.  MOB stated, I didn't know why they were in there and they were used."  CSW had mom to elaborate on what MOB was referring to when she said  "Used."  MOB communicated "The cotton balls looked like they had been used and the nurse did not open them from a new container.  The cotton balls were also irritating my baby's skin."  CSW assured MOB that the cotton balls were not used and informed MOB that if MOB had concerns MOB should have spoke with medical team about the situation.  CSW made MOB aware that CSW was making a report to [place] CPS due to the not being able to get the results of infant's UDS.  CSW will continue to monitor infants CDS and will also report the results to CPS (A report was made to CPS worker, P. Miller).  CSW Plan/Description:  No Further  Intervention Required/No Barriers to Discharge, Sudden Infant Death Syndrome (SIDS) Education, Perinatal Mood and Anxiety Disorder (PMADs) Education, Hospital Drug Screen Policy Information, CSW Will Continue to Monitor Umbilical Cord Tissue Drug Screen Results and Make Report if Warranted   [name], MSW, LCSW Clinical Social Work [phone]  [name], LCSW 05/02/2017, 2:55 PM  

## 2017-05-02 NOTE — Discharge Instructions (Signed)
Postpartum Care After Vaginal Delivery °The period of time right after you deliver your newborn is called the postpartum period. °What kind of medical care will I receive? °· You may continue to receive fluids and medicines through an IV tube inserted into one of your veins. °· If an incision was made near your vagina (episiotomy) or if you had some vaginal tearing during delivery, cold compresses may be placed on your episiotomy or your tear. This helps to reduce pain and swelling. °· You may be given a squirt bottle to use when you go to the bathroom. You may use this until you are comfortable wiping as usual. To use the squirt bottle, follow these steps: °? Before you urinate, fill the squirt bottle with warm water. Do not use hot water. °? After you urinate, while you are sitting on the toilet, use the squirt bottle to rinse the area around your urethra and vaginal opening. This rinses away any urine and blood. °? You may do this instead of wiping. As you start healing, you may use the squirt bottle before wiping yourself. Make sure to wipe gently. °? Fill the squirt bottle with clean water every time you use the bathroom. °· You will be given sanitary pads to wear. °How can I expect to feel? °· You may not feel the need to urinate for several hours after delivery. °· You will have some soreness and pain in your abdomen and vagina. °· If you are breastfeeding, you may have uterine contractions every time you breastfeed for up to several weeks postpartum. Uterine contractions help your uterus return to its normal size. °· It is normal to have vaginal bleeding (lochia) after delivery. The amount and appearance of lochia is often similar to a menstrual period in the first week after delivery. It will gradually decrease over the next few weeks to a dry, yellow-brown discharge. For most women, lochia stops completely by 6-8 weeks after delivery. Vaginal bleeding can vary from woman to woman. °· Within the first few  days after delivery, you may have breast engorgement. This is when your breasts feel heavy, full, and uncomfortable. Your breasts may also throb and feel hard, tightly stretched, warm, and tender. After this occurs, you may have milk leaking from your breasts. Your health care provider can help you relieve discomfort due to breast engorgement. Breast engorgement should go away within a few days. °· You may feel more sad or worried than normal due to hormonal changes after delivery. These feelings should not last more than a few days. If these feelings do not go away after several days, speak with your health care provider. °How should I care for myself? °· Tell your health care provider if you have pain or discomfort. °· Drink enough water to keep your urine clear or pale yellow. °· Wash your hands thoroughly with soap and water for at least 20 seconds after changing your sanitary pads, after using the toilet, and before holding or feeding your baby. °· If you are not breastfeeding, avoid touching your breasts a lot. Doing this can make your breasts produce more milk. °· If you become weak or lightheaded, or you feel like you might faint, ask for help before: °? Getting out of bed. °? Showering. °· Change your sanitary pads frequently. Watch for any changes in your flow, such as a sudden increase in volume, a change in color, the passing of large blood clots. If you pass a blood clot from your vagina, save it   to show to your health care provider. Do not flush blood clots down the toilet without having your health care provider look at them. °· Make sure that all your vaccinations are up to date. This can help protect you and your baby from getting certain diseases. You may need to have immunizations done before you leave the hospital. °· If desired, talk with your health care provider about methods of family planning or birth control (contraception). °How can I start bonding with my baby? °Spending as much time as  possible with your baby is very important. During this time, you and your baby can get to know each other and develop a bond. Having your baby stay with you in your room (rooming in) can give you time to get to know your baby. Rooming in can also help you become comfortable caring for your baby. Breastfeeding can also help you bond with your baby. °How can I plan for returning home with my baby? °· Make sure that you have a car seat installed in your vehicle. °? Your car seat should be checked by a certified car seat installer to make sure that it is installed safely. °? Make sure that your baby fits into the car seat safely. °· Ask your health care provider any questions you have about caring for yourself or your baby. Make sure that you are able to contact your health care provider with any questions after leaving the hospital. °This information is not intended to replace advice given to you by your health care provider. Make sure you discuss any questions you have with your health care provider. °Document Released: 12/16/2006 Document Revised: 07/24/2015 Document Reviewed: 01/23/2015 °Elsevier Interactive Patient Education © 2018 Elsevier Inc. ° ° °Contraception Choices °Contraception, also called birth control, [place] things to use or ways to try not to get pregnant. °Hormonal birth control °This kind of birth control uses hormones. Here are some types of hormonal birth control: °· A tube that is put under skin of the arm (implant). The tube can stay in for as long as 3 years. °· Shots to get every 3 months (injections). °· Pills to take every day (birth control pills). °· A patch to change 1 time each week for 3 weeks (birth control patch). After that, the patch is taken off for 1 week. °· A ring to put in the vagina. The ring is left in for 3 weeks. Then it is taken out of the vagina for 1 week. Then a new ring is put in. °· Pills to take after unprotected sex (emergency birth control pills). ° °Barrier birth  control °Here are some types of barrier birth control: °· A thin covering that is put on the penis before sex (female condom). The covering is thrown away after sex. °· A soft, loose covering that is put in the vagina before sex (female condom). The covering is thrown away after sex. °· A rubber bowl that sits over the cervix (diaphragm). The bowl must be made for you. The bowl is put into the vagina before sex. The bowl is left in for 6-8 hours after sex. It is taken out within 24 hours. °· A small, soft cup that fits over the cervix (cervical cap). The cup must be made for you. The cup can be left in for 6-8 hours after sex. It is taken out within 48 hours. °· A sponge that is put into the vagina before sex. It must be left in for at least 6   hours after sex. It must be taken out within 30 hours. Then it is thrown away. °· A chemical that kills or stops sperm from getting into the uterus (spermicide). It may be a pill, cream, jelly, or foam to put in the vagina. The chemical should be used at least 10-15 minutes before sex. ° °IUD (intrauterine) birth control °An IUD is a small, T-shaped piece of plastic. It is put inside the uterus. There are two kinds: °· Hormone IUD. This kind can stay in for 3-5 years. °· Copper IUD. This kind can stay in for 10 years. ° °Permanent birth control °Here are some types of permanent birth control: °· Surgery to block the fallopian tubes. °· Having an insert put into each fallopian tube. °· Surgery to tie off the tubes that carry sperm (vasectomy). ° °Natural planning birth control °Here are some types of natural planning birth control: °· Not having sex on the days the woman could get pregnant. °· Using a calendar: °? To keep track of the length of each period. °? To find out what days pregnancy can happen. °? To plan to not have sex on days when pregnancy can happen. °· Watching for symptoms of ovulation and not having sex during ovulation. One way the woman can check for ovulation  is to check her temperature. °· Waiting to have sex until after ovulation. ° °Summary °· Contraception, also called birth control, [place] things to use or ways to try not to get pregnant. °· Hormonal methods of birth control include implants, injections, pills, patches, vaginal rings, and emergency birth control pills. °· Barrier methods of birth control can include female condoms, female condoms, diaphragms, cervical caps, sponges, and spermicides. °· There are two types of IUD (intrauterine device) birth control. An IUD can be put in a woman's uterus to prevent pregnancy for 3-5 years. °· Permanent sterilization can be done through a procedure for males, females, or both. °· Natural planning methods involve not having sex on the days when the woman could get pregnant. °This information is not intended to replace advice given to you by your health care provider. Make sure you discuss any questions you have with your health care provider. °Document Released: 12/16/2008 Document Revised: 02/29/2016 Document Reviewed: 02/29/2016 °Elsevier Interactive Patient Education © 2017 Elsevier Inc. ° °

## 2017-05-02 NOTE — Discharge Summary (Signed)
OB Discharge Summary     Patient Name: Anne Jensen DOB: 1995-10-09 MRN: [id number]  Date of admission: 04/29/2017 Delivering MD: Rolm Bookbinder   Date of discharge: 05/02/2017  Admitting diagnosis: 39wks induction  Intrauterine pregnancy: [redacted]w[redacted]d     Secondary diagnosis:  Principal Problem:   SVD (spontaneous vaginal delivery) Active Problems:   Gestational diabetes mellitus   Poor fetal growth   IUGR (intrauterine growth restriction) affecting care of mother    Discharge diagnosis: Term Pregnancy Delivered                                                                                                Post partum procedures:none  Augmentation: AROM, Cytotec and Foley Balloon  Complications: None  Hospital course:  Induction of Labor With Vaginal Delivery   22 y.o. yo G1P0000 at [redacted]w[redacted]d was admitted to the hospital 04/29/2017 for induction of labor.  Indication for induction: A1 DM.  Patient had an uncomplicated labor course as follows: Membrane Rupture Time/Date: 8:02 AM ,04/30/2017   Intrapartum Procedures: Episiotomy: None [1]                                         Lacerations:  None [1]  Patient had delivery of a Viable infant.  Information for the patient's newborn:  Bakula, Girl Kady [[id number]]  Delivery Method: Vaginal, Spontaneous(Filed from Delivery Summary)   04/30/2017  Details of delivery can be found in separate delivery note.  Patient had a routine postpartum course. Patient is discharged home 05/02/17.  Physical exam  Vitals:   04/30/17 2300 05/01/17 0300 05/01/17 1758 05/02/17 0611  BP: (!) 111/57 110/65 130/72 115/70  Pulse: 90 84 75 94  Resp: 16 16 18 18   Temp: 98.6 F (37 C) 98.6 F (37 C) 98 F (36.7 C) 98.3 F (36.8 C)  TempSrc: Oral  Oral Oral  SpO2: 99% 100%  98%  Weight:      Height:       General: alert, cooperative and no distress Lochia: appropriate Uterine Fundus: firm Incision: N/A DVT Evaluation: No evidence of DVT seen on  physical exam. Labs: Lab Results  Component Value Date   WBC 25.6 (H) 05/01/2017   HGB 9.8 (L) 05/01/2017   HCT 28.6 (L) 05/01/2017   MCV 75.9 (L) 05/01/2017   PLT 365 05/01/2017   CMP Latest Ref Rng & Units 04/29/2017  Glucose 65 - 99 mg/dL 84  BUN 6 - 20 mg/dL 7  Creatinine 7.82 - 9.56 mg/dL 2.13  Sodium 086 - 578 mmol/L 133(L)  Potassium 3.5 - 5.1 mmol/L 3.9  Chloride 101 - 111 mmol/L 105  CO2 22 - 32 mmol/L 18(L)  Calcium 8.9 - 10.3 mg/dL 9.1  Total Protein 6.5 - 8.1 g/dL 6.7  Total Bilirubin 0.3 - 1.2 mg/dL 0.7  Alkaline Phos 38 - 126 U/L 120  AST 15 - 41 U/L 19  ALT 14 - 54 U/L 15    Discharge instruction: per After Visit Summary and "  Baby and Me Booklet".  After visit meds:  Allergies as of 05/02/2017      Reactions   Morphine And Related Other (See Comments)   Makes skin burn      Medication List    TAKE these medications   ferrous sulfate 325 (65 FE) MG tablet Take 1 tablet (325 mg total) by mouth daily with breakfast.   ibuprofen 600 MG tablet Commonly known as:  ADVIL,MOTRIN Take 1 tablet (600 mg total) by mouth every 6 (six) hours.   prenatal multivitamin Tabs tablet Take 1 tablet by mouth daily at 12 noon.       Diet: routine diet  Activity: Advance as tolerated. Pelvic rest for 6 weeks.   Outpatient follow up: 4- weeks  -- will need 2-hr GTT Follow up Appt: Future Appointments  Date Time Provider Department Center  05/28/2017 10:30 AM Roe Coombsenney, Rachelle A, CNM [facility] None   Follow up Visit:No Follow-up on file.  Postpartum contraception: undecided; considering pills  Newborn Data: Live born female  Birth Weight: 6 lb 4 oz (2835 g) APGAR: 9, 9  Newborn Delivery   Birth date/time:  04/30/2017 20:02:00 Delivery type:  Vaginal, Spontaneous     Baby Feeding: Bottle and Breast Disposition:home with mother  05/02/2017 Frederik PearJulie P Irisha Grandmaison, MD

## 2017-05-28 ENCOUNTER — Ambulatory Visit: Payer: Medicaid Other | Admitting: Certified Nurse Midwife

## 2017-06-05 ENCOUNTER — Encounter: Payer: Self-pay | Admitting: Certified Nurse Midwife

## 2017-06-05 ENCOUNTER — Ambulatory Visit (INDEPENDENT_AMBULATORY_CARE_PROVIDER_SITE_OTHER): Payer: Medicaid Other | Admitting: Certified Nurse Midwife

## 2017-06-05 ENCOUNTER — Other Ambulatory Visit (HOSPITAL_COMMUNITY)
Admission: RE | Admit: 2017-06-05 | Discharge: 2017-06-05 | Disposition: A | Discharge status: Home / Self Care | Payer: Medicaid Other | Source: Ambulatory Visit | Attending: Certified Nurse Midwife | Admitting: Certified Nurse Midwife

## 2017-06-05 DIAGNOSIS — Z1389 Encounter for screening for other disorder: Secondary | ICD-10-CM

## 2017-06-05 NOTE — Progress Notes (Signed)
Post Partum Exam  Anne Jensen is a 22 y.o. 491P0000 female who presents for a postpartum visit. She is 5 weeks postpartum following a spontaneous vaginal delivery. I have fully reviewed the prenatal and intrapartum course. The delivery was at 39wk 5days gestational weeks.  Anesthesia: none. Postpartum course has been unremarkable. Baby's course has been unremarkable. Baby is feeding by breast. Bleeding no bleeding. Bowel function is "hard stools.". Bladder function is normal. Patient is not sexually active. Contraception method is none. Postpartum depression screening:neg  The following portions of the patient's history were reviewed and updated as appropriate: allergies, current medications, past family history, past medical history, past social history, past surgical history and problem list. Last pap smear done unknown   Review of Systems Pertinent items noted in HPI and remainder of comprehensive ROS otherwise negative.    Objective:  Blood pressure 107/71, pulse 70, height 5\' 3"  (1.6 m), weight 239 lb (108.4 kg), currently breastfeeding.  General:  alert, cooperative and no distress   Breasts:  inspection negative, no nipple discharge or bleeding, no masses or nodularity palpable  Lungs: clear to auscultation bilaterally  Heart:  regular rate and rhythm, S1, S2 normal, no murmur, click, rub or gallop  Abdomen: soft, non-tender; bowel sounds normal; no masses,  no organomegaly   Vulva:  normal  Vagina: normal vagina  Cervix:  no bleeding following Pap  Corpus: normal size, contour, position, consistency, mobility, non-tender  Adnexa:  normal adnexa  Rectal Exam: Not performed.        Assessment:    Normal 5 week postpartum exam. Pap smear done at today's visit.   Breast feeding status: excellent  Hx of GDM: needs f/u OGTT testing.   Plan:   1. Contraception: abstinence 2. Encouraged to call office once sexually active for BF Progesterone options: POP, Nexplanon, Depo provera  injections or IUD.  3. Follow up in: 1 year for annual exam or as needed. 2 hour OGTT d/t hx of GDM scheduled.

## 2017-06-06 LAB — CERVICOVAGINAL ANCILLARY ONLY
BACTERIAL VAGINITIS: POSITIVE — AB
Candida vaginitis: NEGATIVE

## 2017-06-06 NOTE — Addendum Note (Signed)
Addended by: Orvilla CornwallENNEY, Laqueisha Catalina A on: 06/06/2017 08:16 AM   Modules accepted: Orders

## 2017-06-09 ENCOUNTER — Other Ambulatory Visit: Payer: Medicaid Other

## 2017-06-10 ENCOUNTER — Encounter (INDEPENDENT_AMBULATORY_CARE_PROVIDER_SITE_OTHER): Payer: Self-pay

## 2017-06-10 ENCOUNTER — Other Ambulatory Visit: Payer: Self-pay | Admitting: Certified Nurse Midwife

## 2017-06-10 DIAGNOSIS — N760 Acute vaginitis: Principal | ICD-10-CM

## 2017-06-10 DIAGNOSIS — B9689 Other specified bacterial agents as the cause of diseases classified elsewhere: Secondary | ICD-10-CM

## 2017-06-10 MED ORDER — METRONIDAZOLE 500 MG PO TABS: 500.0000 mg | ORAL_TABLET | Freq: Two times a day (BID) | ORAL | 0 refills | Status: DC

## 2017-06-11 LAB — CYTOLOGY - PAP: DIAGNOSIS: NEGATIVE

## 2017-06-12 ENCOUNTER — Other Ambulatory Visit: Payer: Self-pay | Admitting: Certified Nurse Midwife

## 2017-12-04 ENCOUNTER — Other Ambulatory Visit (HOSPITAL_COMMUNITY): Payer: Self-pay

## 2017-12-04 ENCOUNTER — Emergency Department (HOSPITAL_COMMUNITY)
Admission: EM | Admit: 2017-12-04 | Discharge: 2017-12-04 | Disposition: A | Discharge status: Home / Self Care | Payer: Self-pay | Attending: Emergency Medicine | Admitting: Emergency Medicine

## 2017-12-04 ENCOUNTER — Other Ambulatory Visit: Payer: Self-pay

## 2017-12-04 ENCOUNTER — Encounter (HOSPITAL_COMMUNITY): Payer: Self-pay

## 2017-12-04 ENCOUNTER — Emergency Department (HOSPITAL_COMMUNITY): Payer: Self-pay

## 2017-12-04 DIAGNOSIS — N12 Tubulo-interstitial nephritis, not specified as acute or chronic: Secondary | ICD-10-CM | POA: Insufficient documentation

## 2017-12-04 DIAGNOSIS — J45909 Unspecified asthma, uncomplicated: Secondary | ICD-10-CM | POA: Insufficient documentation

## 2017-12-04 LAB — URINALYSIS, ROUTINE W REFLEX MICROSCOPIC
BILIRUBIN URINE: NEGATIVE
Bacteria, UA: NONE SEEN
Glucose, UA: NEGATIVE mg/dL
Ketones, ur: NEGATIVE mg/dL
NITRITE: NEGATIVE
Protein, ur: NEGATIVE mg/dL
SPECIFIC GRAVITY, URINE: 1 — AB (ref 1.005–1.030)
pH: 6 (ref 5.0–8.0)

## 2017-12-04 LAB — COMPREHENSIVE METABOLIC PANEL
ALBUMIN: 3.4 g/dL — AB (ref 3.5–5.0)
ALK PHOS: 82 U/L (ref 38–126)
ALT: 18 U/L (ref 0–44)
ANION GAP: 11 (ref 5–15)
AST: 28 U/L (ref 15–41)
BILIRUBIN TOTAL: 0.8 mg/dL (ref 0.3–1.2)
BUN: 8 mg/dL (ref 6–20)
CALCIUM: 8.5 mg/dL — AB (ref 8.9–10.3)
CO2: 21 mmol/L — ABNORMAL LOW (ref 22–32)
Chloride: 100 mmol/L (ref 98–111)
Creatinine, Ser: 1.1 mg/dL — ABNORMAL HIGH (ref 0.44–1.00)
GFR calc Af Amer: >60 mL/min (ref >60)
GFR calc non Af Amer: >60 mL/min (ref >60)
GLUCOSE: 139 mg/dL — AB (ref 70–99)
Potassium: 3.7 mmol/L (ref 3.5–5.1)
Sodium: 132 mmol/L — ABNORMAL LOW (ref 135–145)
TOTAL PROTEIN: 7.2 g/dL (ref 6.5–8.1)

## 2017-12-04 LAB — CBC
HCT: 38.1 % (ref 36.0–46.0)
HEMOGLOBIN: 12.2 g/dL (ref 12.0–15.0)
MCH: 25.6 pg — ABNORMAL LOW (ref 26.0–34.0)
MCHC: 32 g/dL (ref 30.0–36.0)
MCV: 79.9 fL (ref 78.0–100.0)
Platelets: 302 10*3/uL (ref 150–400)
RBC: 4.77 MIL/uL (ref 3.87–5.11)
RDW: 14.3 % (ref 11.5–15.5)
WBC: 18.1 10*3/uL — ABNORMAL HIGH (ref 4.0–10.5)

## 2017-12-04 LAB — I-STAT CG4 LACTIC ACID, ED: Lactic Acid, Venous: 1.03 mmol/L (ref 0.5–1.9)

## 2017-12-04 LAB — I-STAT BETA HCG BLOOD, ED (MC, WL, AP ONLY): I-stat hCG, quantitative: 6 m[IU]/mL — ABNORMAL HIGH (ref <5)

## 2017-12-04 LAB — PREGNANCY, URINE: PREG TEST UR: NEGATIVE

## 2017-12-04 LAB — LIPASE, BLOOD: Lipase: 19 U/L (ref 11–51)

## 2017-12-04 MED ORDER — ACETAMINOPHEN 325 MG PO TABS
650.0000 mg | ORAL_TABLET | Freq: Once | ORAL | Status: AC | PRN
  Administered 2017-12-04: 650 mg via ORAL
  Filled 2017-12-04: qty 2

## 2017-12-04 MED ORDER — ACETAMINOPHEN 325 MG PO TABS
325.0000 mg | ORAL_TABLET | Freq: Once | ORAL | Status: AC
  Administered 2017-12-04: 325 mg via ORAL
  Filled 2017-12-04: qty 1

## 2017-12-04 MED ORDER — SODIUM CHLORIDE 0.9 % IV BOLUS
1000.0000 mL | Freq: Once | INTRAVENOUS | Status: AC
Start: 2017-12-04 — End: 2017-12-04
  Administered 2017-12-04: 1000 mL via INTRAVENOUS

## 2017-12-04 MED ORDER — IOHEXOL 300 MG/ML  SOLN
100.0000 mL | Freq: Once | INTRAMUSCULAR | Status: AC | PRN
  Administered 2017-12-04: 100 mL via INTRAVENOUS

## 2017-12-04 MED ORDER — SODIUM CHLORIDE 0.9 % IV SOLN
2.0000 g | Freq: Once | INTRAVENOUS | Status: AC
  Administered 2017-12-04: 2 g via INTRAVENOUS
  Filled 2017-12-04: qty 2

## 2017-12-04 MED ORDER — CEPHALEXIN 500 MG PO CAPS: 500.0000 mg | ORAL_CAPSULE | Freq: Three times a day (TID) | ORAL | 0 refills | Status: AC

## 2017-12-04 MED ORDER — ONDANSETRON 4 MG PO TBDP
4.0000 mg | ORAL_TABLET | Freq: Once | ORAL | Status: AC | PRN
  Administered 2017-12-04: 4 mg via ORAL
  Filled 2017-12-04: qty 1

## 2017-12-04 NOTE — Discharge Instructions (Signed)
Ms. Clippinger,   It was a pleasure taking care of you here at the Ed. Your symptoms and all the lab/imaging studies we did shows that you have an infection in your kidneys. I will discharge you with antibiotics (Keflex) and you will take 1 tablet three times a day for 10 days.  ~Take Care

## 2017-12-04 NOTE — ED Notes (Signed)
Patient transported to CT 

## 2017-12-04 NOTE — ED Notes (Signed)
MD Campos at bedside to attempt IV.

## 2017-12-04 NOTE — ED Provider Notes (Signed)
[facility] EMERGENCY DEPARTMENT Provider Note   CSN: [id number] Arrival date & time: 12/04/17  1107     History   Chief Complaint Chief Complaint  Patient presents with  . Abdominal Pain    HPI Anne Jensen is a 22 y.o. female.  With medical history significant for asthma, endometriosis, gestational diabetes, spontaneous vaginal delivery in February 2019, bacterial vaginosis s/p treatment in May 2019 presented with 3-day history of acute bilateral lower abdominal pain.  She describes the pain as crampy in nature and sometimes radiate to the right flank.  Initially pain was at 8/10 but improved to 6/10 after taking over-the-counter Azo.  Since onset, she has had nausea, nonbilious nonbloody emesis, fevers and chills.  She also endorse dysuria, urinary frequency and urinary urgency.  She denies any vaginal discharge.  She endorses a nonproductive dry cough but denies shortness of breath, palpitation or any sick contacts recently.  Currently she does not endorse lower abdominal pain, back pain but does complain of feeling nauseous and feverish.    Past Medical History:  Diagnosis Date  . Asthma   . Endometriosis   . Supervision of other normal pregnancy, antepartum 02/26/2017    Clinic  [facility] Prenatal Labs Dating  LMP Blood type: O/Positive/-- (12/26 1706) o pos Genetic Screen 1 Screen:    AFP:     Quad:     NIPS: Antibody:Negative (12/26 1706)neg Anatomic [place]  Rubella: 1.32 (12/26 1706)imm GTT 1 of 3 2-hr values elevated RPR: Non Reactive (01/02 1214) neg Flu vaccine   03/2017 at pharmacy HBsAg: Negative (12/26 1706) neg TDaP vaccine                                       Patient Active Problem List   Diagnosis Date Noted  . Asthma 03/12/2017  . Gestational diabetes mellitus 03/12/2017    Past Surgical History:  Procedure Laterality Date  . NO PAST SURGERIES       OB History    Gravida  1   Para  1   Term  1   Preterm  0   AB  0   Living  1       SAB  0   TAB  0   Ectopic  0   Multiple  0   Live Births  1            Home Medications    Prior to Admission medications   Medication Sig Start Date End Date Taking? Authorizing Provider  ferrous sulfate 325 (65 FE) MG tablet Take 1 tablet (325 mg total) by mouth daily with breakfast. Patient not taking: Reported on 06/05/2017 03/06/17   Brock Bad, MD  ibuprofen (ADVIL,MOTRIN) 600 MG tablet Take 1 tablet (600 mg total) by mouth every 6 (six) hours. Patient not taking: Reported on 06/05/2017 05/02/17   Degele, Kandra Nicolas, MD  metroNIDAZOLE (FLAGYL) 500 MG tablet Take 1 tablet (500 mg total) by mouth 2 (two) times daily. 06/10/17   Roe Coombs, CNM  Prenatal Vit-Fe Fumarate-FA (PRENATAL MULTIVITAMIN) TABS tablet Take 1 tablet by mouth daily at 12 noon.    [provider]    Family History Denies any family history of diabetes, hypertension or renal abnormalities.  Social History Social History   Tobacco Use  . Smoking status: Never Smoker  . Smokeless tobacco: Never Used  Substance Use  Topics  . Alcohol use: No  . Drug use: No     Allergies   Morphine and related   Review of Systems Review of Systems  Constitutional: Positive for appetite change, chills and fever.  HENT: Negative.   Eyes: Negative.   Respiratory: Positive for cough.   Cardiovascular: Negative.   Gastrointestinal: Positive for abdominal pain, nausea and vomiting.  Genitourinary: Positive for dysuria, flank pain, frequency and urgency. Negative for vaginal discharge.  Neurological: Positive for headaches (each time she coughs ).  Psychiatric/Behavioral: Negative.       Physical Exam Updated Vital Signs BP (!) 124/110 (BP Location: Left Arm)   Pulse (!) 110   Temp (!) 103 F (39.4 C) (Oral)   Resp 18   LMP 12/01/2017 (Approximate)   SpO2 100%   Physical Exam  Constitutional: She appears well-developed and well-nourished.  Non-toxic appearance. She does not appear  ill. No distress.  HENT:  Head: Normocephalic and atraumatic.  Eyes: No scleral icterus.  Cardiovascular: Regular rhythm. Tachycardia present. Exam reveals no gallop and no friction rub.  No murmur heard. Pulmonary/Chest: Effort normal and breath sounds normal. She has no wheezes. She has no rales.  Abdominal: Soft. Bowel sounds are normal. She exhibits no distension. There is no tenderness. There is no guarding.  I was unable to appreciate CVA tenderness but she did report of " irritation" with palpation of the right flank.  Neurological: She is alert.  Skin: Skin is warm.  Psychiatric: She has a normal mood and affect. Her behavior is normal. Judgment and thought content normal.     ED Treatments / Results  Labs (all labs ordered are listed, but only abnormal results are displayed) Labs Reviewed  CBC - Abnormal; Notable for the following components:      Result Value   WBC 18.1 (*)    MCH 25.6 (*)    All other components within normal limits  URINALYSIS, ROUTINE W REFLEX MICROSCOPIC - Abnormal; Notable for the following components:   Color, Urine STRAW (*)    Specific Gravity, Urine 1.000 (*)    Hgb urine dipstick MODERATE (*)    Leukocytes, UA LARGE (*)    All other components within normal limits  I-STAT BETA HCG BLOOD, ED ([facility], [facility], [facility] ONLY) - Abnormal; Notable for the following components:   I-stat hCG, quantitative 6.0 (*)    All other components within normal limits  URINE CULTURE  LIPASE, BLOOD  COMPREHENSIVE METABOLIC PANEL  I-STAT CG4 LACTIC ACID, ED    EKG None  Radiology No results found.  Procedures Procedures (including critical care time)  Medications Ordered in ED Medications  ondansetron (ZOFRAN-ODT) disintegrating tablet 4 mg (4 mg Oral Given 12/04/17 1128)  acetaminophen (TYLENOL) tablet 650 mg (650 mg Oral Given 12/04/17 1128)     Initial Impression / Assessment and Plan / ED Course  I have reviewed the triage vital signs and the nursing  notes.  Pertinent labs & imaging results that were available during my care of the patient were reviewed by me and considered in my medical decision making (see chart for details).   22 year old otherwise healthy woman with spontaneous vaginal delivery in February 2019 presenting with bilateral lower abdominal pain with intermittent radiation to the right flank, fevers, chills, dysuria, urinary frequency and urgency.  On arrival to the ED she was febrile to 103, tachycardic to 110, BP of 124/110, leukocytosis of 18, urinalysis shows a concentrated urine, moderate hemoglobin (as patient is on her  menses), large leukocytes. CMP significant for hyponatremia of 132, creatinine of 1.10 from baseline of 0.55. Lactic acid is normal.    Repeat urine beta-hCG was negative  AKI is most likely secondary to dehydration from recent history of nausea and vomiting.  Differential diagnosis includes urinary tract infection, pyelonephritis, tubal ovarian process, nephrolithiasis, sexually transmitted disease, appendicitis, endometritis.  Impression from CT Abdomen pelvis suggestive of Pyelonephritis. She is otherwise hemodynamically stable and is safe to discharge home with Keflex 500mg  TID x10 days. She is to follow up with her PCP or return to ED if symptoms worsens.   Impression: Acute right perinephric strandy edema, patchy diffuse right kidney striated nephrogram without hydronephrosis or obstructing ureteral calculus. Right kidney findings are more compatible with ascending urinary tract infection or pyelonephritis.   Final Clinical Impressions(s) / ED Diagnoses   Final diagnoses:  None    ED Discharge Orders    None       Yvette Rack, MD 12/04/17 1851    Azalia Bilis, MD 12/05/17 0005

## 2017-12-04 NOTE — ED Notes (Addendum)
Per IV team, pt does not have any access due to tiny veins on ultrasound that blow. This RN has also stuck pt twice with no success. MD [facility] notified.

## 2017-12-04 NOTE — ED Triage Notes (Signed)
Pt presents for evaluation of abd pain with N/V/D. States has headache that she attributes to vomiting so much. Denies any sick contacts. Pt reports has had some burning with urination that was resolved with azos.

## 2017-12-04 NOTE — ED Notes (Signed)
Patient verbalizes understanding of discharge instructions. Opportunity for questioning and answers were provided. Armband removed by staff, pt discharged from ED in wheelchair. Pt states she is feeling better.Marland Kitchen

## 2017-12-06 LAB — URINE CULTURE: Culture: >=100000 — AB

## 2017-12-07 ENCOUNTER — Telehealth: Payer: Self-pay

## 2017-12-07 NOTE — Telephone Encounter (Signed)
Post ED Visit - Positive Culture Follow-up  Culture report reviewed by antimicrobial stewardship pharmacist:  []  Enzo Bi, Pharm.D. []  Celedonio Miyamoto, Pharm.D., BCPS AQ-ID []  Garvin Fila, Pharm.D., BCPS []  Georgina Pillion, [address].D., BCPS []  [facility], [address].D., BCPS, AAHIVP []  Estella Husk, Pharm.D., BCPS, AAHIVP []  Lysle Pearl, PharmD, BCPS []  Phillips Climes, PharmD, BCPS [x]  Agapito Games, PharmD, BCPS []  Verlan Friends, PharmD  Positive urine culture Treated with Cephalexin, organism sensitive to the same and no further patient follow-up is required at this time.  Jerry Caras 12/07/2017, 9:53 AM

## 2017-12-09 LAB — CULTURE, BLOOD (ROUTINE X 2)
CULTURE: NO GROWTH
Culture: NO GROWTH
SPECIAL REQUESTS: ADEQUATE

## 2018-05-21 IMAGING — US US MFM OB FOLLOW-UP
1 series · 14 of 28 positions shown · non-contrast
Comparison: none

[Series 1: us mfm ob follow-up · 56 acquisitions, 14 frames shown]
[im 3/56]
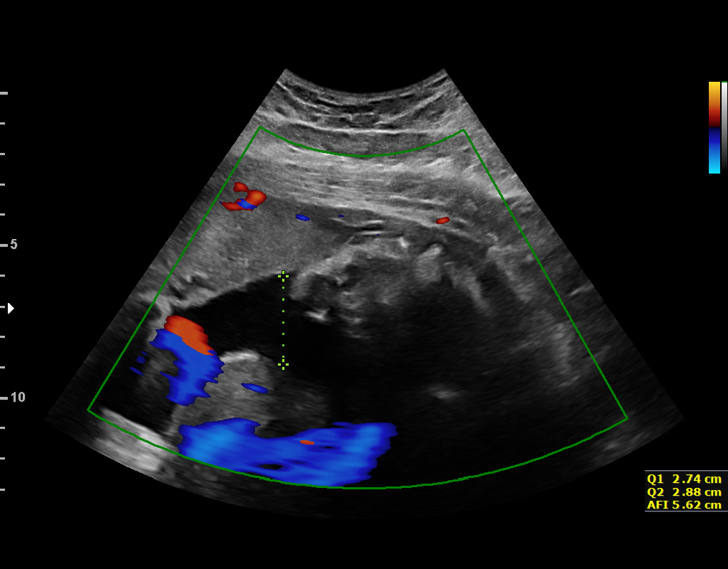
[im 7/56]
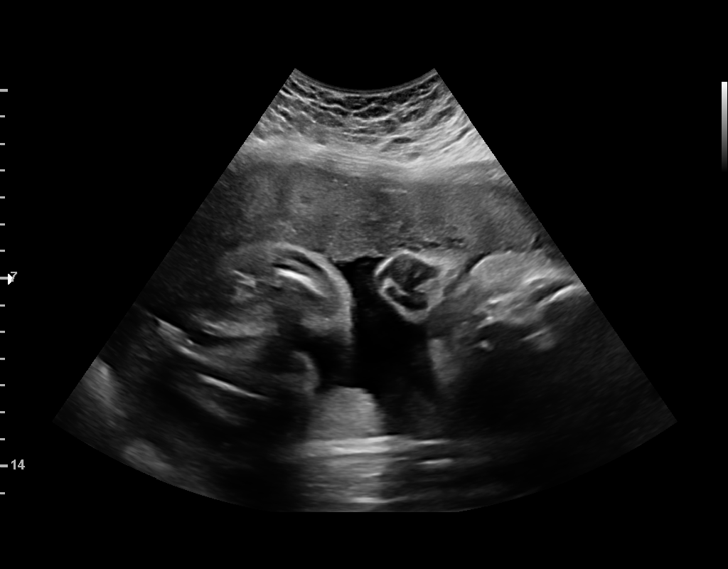
[im 11/56]
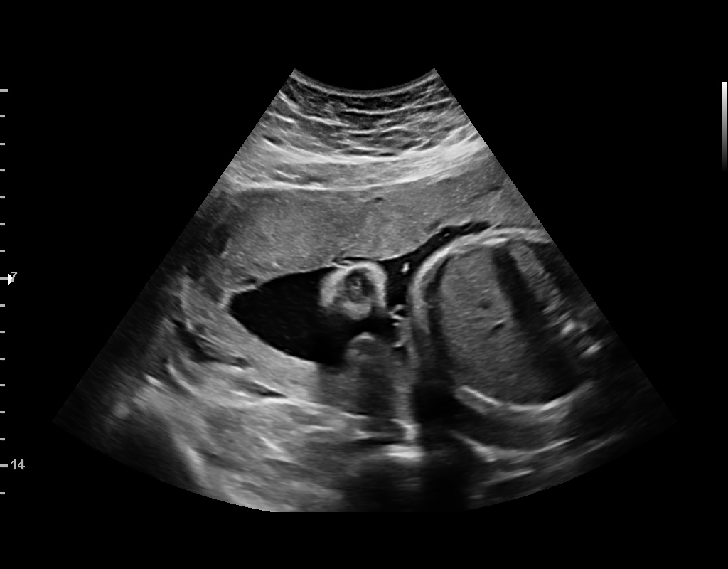
[im 15/56]
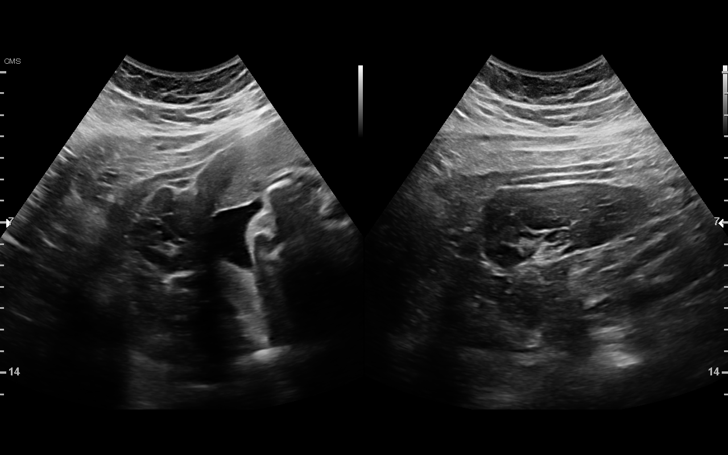
[im 19/56]
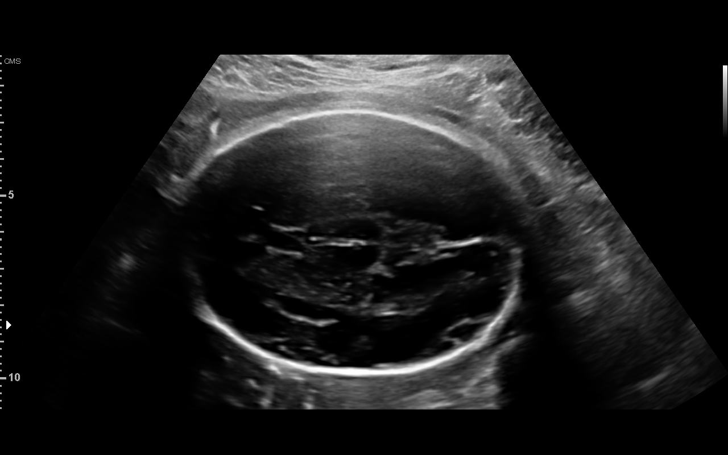
[im 23/56]
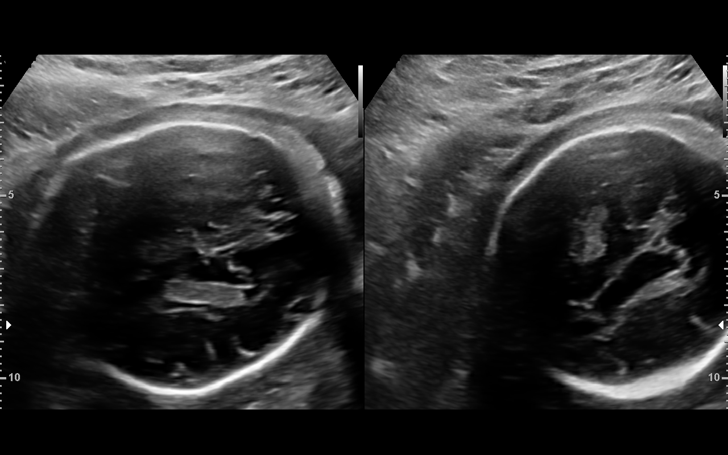
[im 27/56]
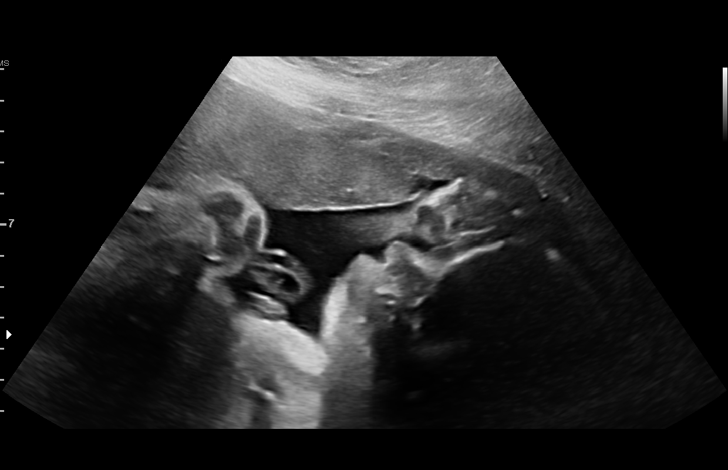
[im 31/56]
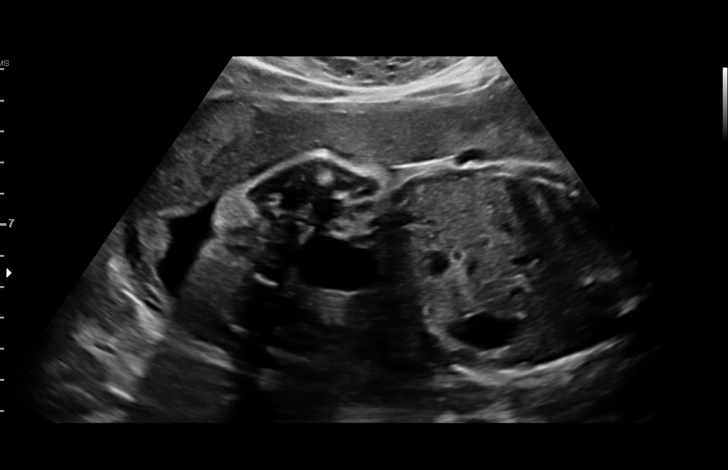
[im 35/56]
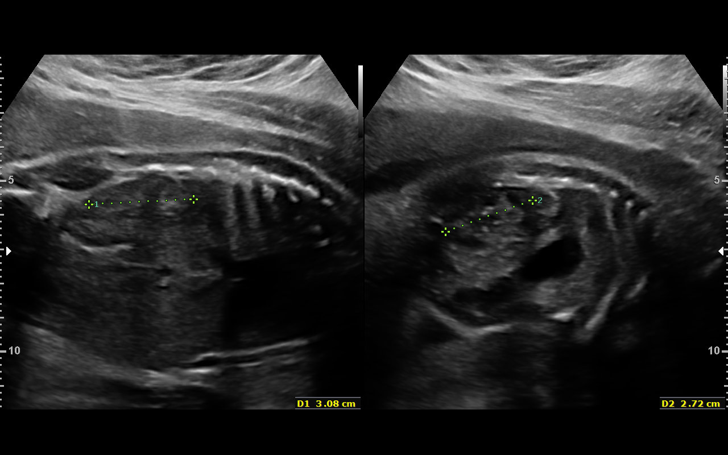
[im 39/56]
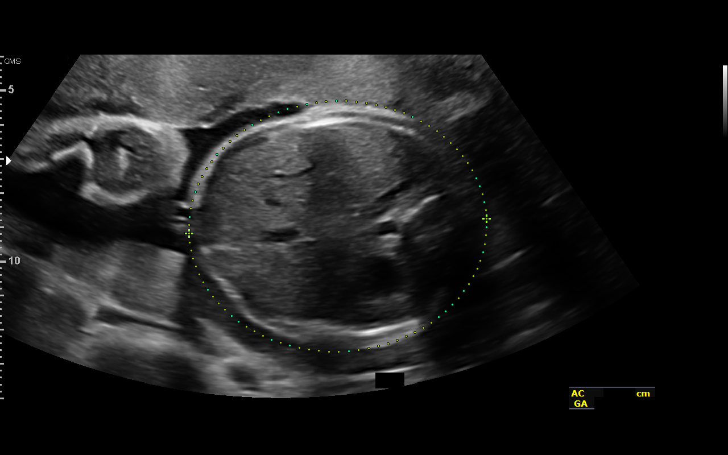
[im 43/56]
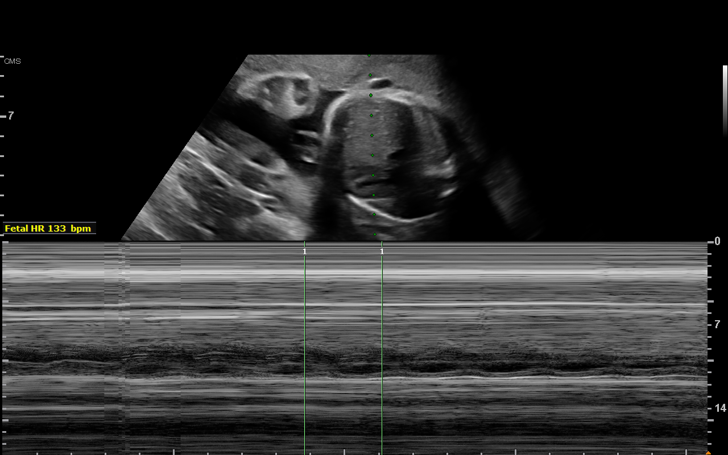
[im 47/56]
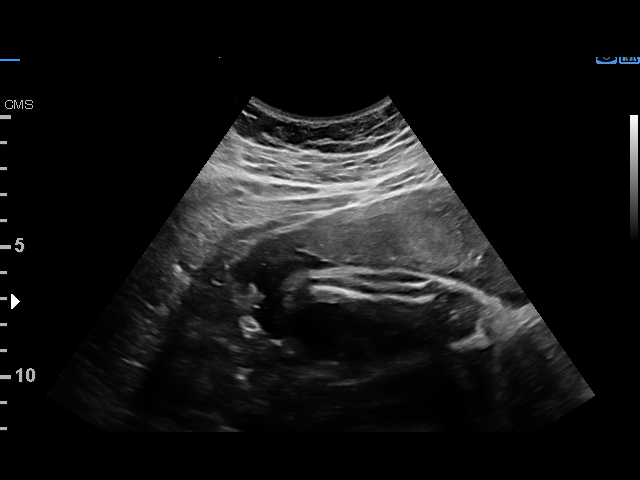
[im 51/56]
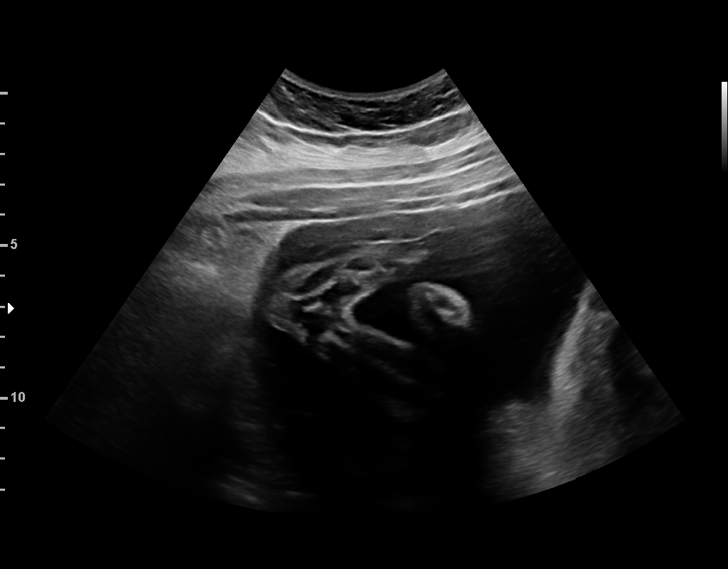
[im 56/56]
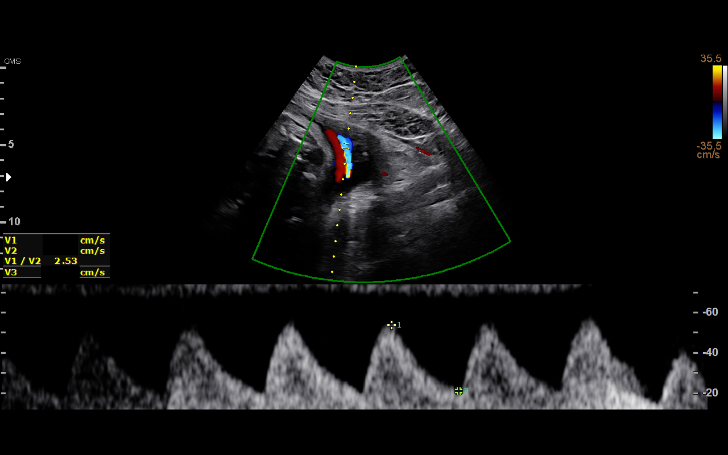

[14 of 28 positions shown; findings below may reference images not displayed]

SHACQUEL NP

1  MORTON PIN            [id number]      [id number]     [id number]
Indications

30 weeks gestation of pregnancy
Insufficient Prenatal Care
Obesity complicating pregnancy
Club foot
OB History

Blood Type:            Height:  5'6"   Weight (lb):  224       BMI:
Gravidity:    1
Fetal Evaluation

Num Of Fetuses:     1
Fetal Heart         133
Rate(bpm):
Cardiac Activity:   Observed
Presentation:       Cephalic
Placenta:           Anterior, above cervical os
P. Cord Insertion:  Visualized

Amniotic Fluid
AFI FV:      Subjectively within normal limits

AFI Sum(cm)     %Tile       Largest Pocket(cm)
12.69           36

RUQ(cm)       RLQ(cm)       LUQ(cm)        LLQ(cm)
2.74
Biometry

BPD:      71.3  mm     G. Age:  28w 4d          2  %    CI:        68.57   %    70 - 86
FL/HC:      18.4   %    19.3 -
HC:      275.2  mm     G. Age:  30w 0d          7  %    HC/AC:      1.10        0.96 -
AC:      251.1  mm     G. Age:  29w 2d         12  %    FL/BPD:     71.1   %    71 - 87
FL:       50.7  mm     G. Age:  27w 1d        < 3  %    FL/AC:      20.2   %    20 - 24
HUM:      47.9  mm     G. Age:  28w 0d        < 5  %

Est. FW:    0089  gm    2 lb 12 oz      18  %
Gestational Age

LMP:           30w 5d        Date:  07/26/16                 EDD:   05/02/17
U/S Today:     28w 5d                                        EDD:   05/16/17
Best:          30w 5d     Det. By:  LMP  (07/26/16)          EDD:   05/02/17
Anatomy

Cranium:               Appears normal         Aortic Arch:            Previously seen
Cavum:                 Appears normal         Ductal Arch:            Previously seen
Ventricles:            Appears normal         Diaphragm:              Previously seen
Choroid Plexus:        Appears normal         Stomach:                Appears normal, left
sided
Cerebellum:            Appears normal         Abdomen:                Appears normal
Posterior Fossa:       Appears normal         Abdominal Wall:         Not well visualized
Nuchal Fold:           Not applicable (>20    Cord Vessels:           Appears normal (3
wks GA)                                        vessel cord)
Face:                  Orbits nl; profile     Kidneys:                Appear normal
prev visualized
Lips:                  Appears normal         Bladder:                Appears normal
Thoracic:              Appears normal         Spine:                  Previously seen
Heart:                 Previously seen        Upper Extremities:      Previously seen
RVOT:                  Previously seen        Lower Extremities:      Right clubfoot
LVOT:                  Previously seen

Other:  Fetus appears to be a female. Nasal bone visualized. Technically
difficult due to maternal habitus and fetal position.
Cervix Uterus Adnexa

Cervix
Not visualized (advanced GA >76wks)

Uterus
No abnormality visualized.

Left Ovary
Within normal limits.

Right Ovary
Within normal limits.

Cul De Sac:   No free fluid seen.

Adnexa:       No abnormality visualized.
Impression

SIUP at 30+5 weeks
Unilateral club foot
All other interval fetal anatomy was seen and appeared
normal; anatomic survey complete except for the CI
Normal amniotic fluid volume
Appropriate interval growth with EFW at the 18th %tile; AC at
the 12th %tile
(UA dopplers normal)
Recommendations

Arranging an appt for a pediatric orthopedist
Follow-up ultrasound for growth in 3 weeks

## 2018-06-17 IMAGING — US US MFM UA CORD DOPPLER
1 series · 13 of 28 positions shown · non-contrast
Comparison: none

[Series 1: us mfm ua cord doppler · 56 acquisitions, 13 frames shown]
[im 3/56]
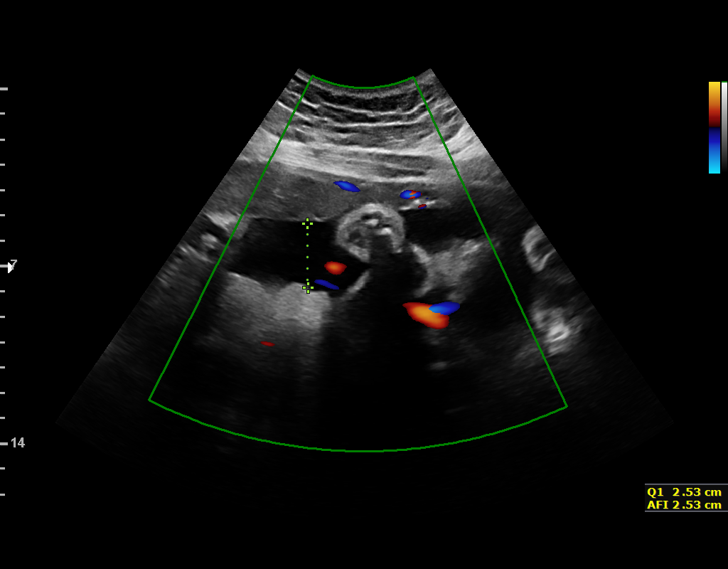
[im 7/56]
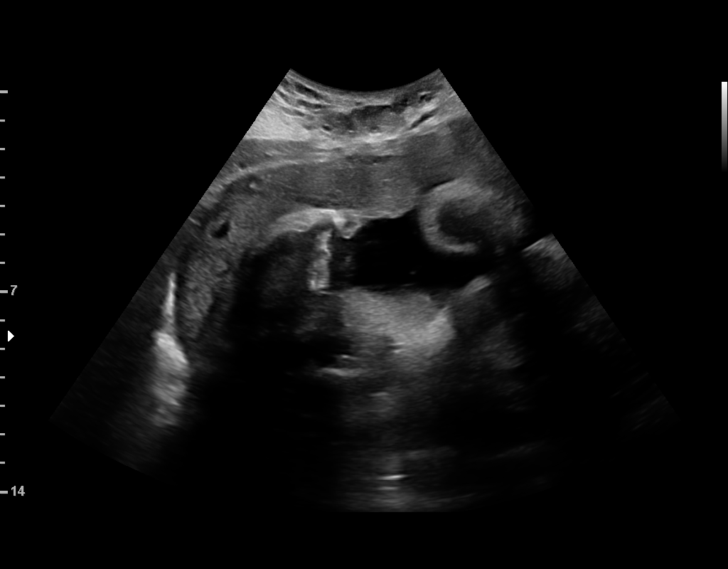
[im 11/56]
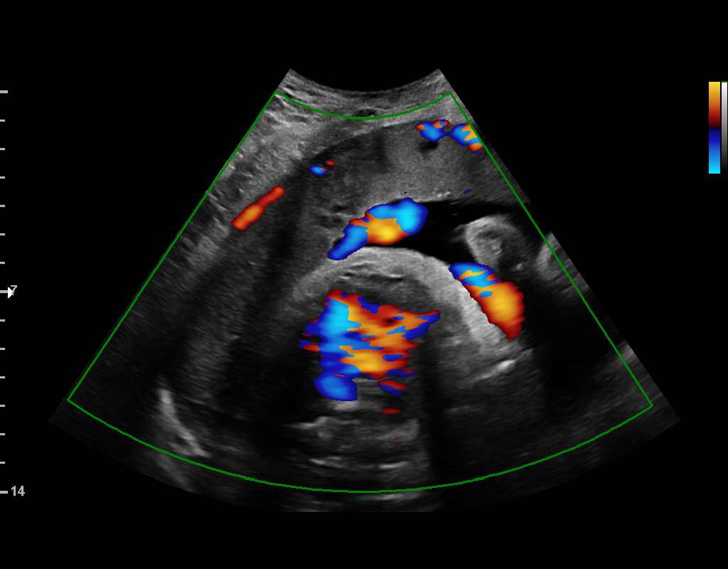
[im 15/56]
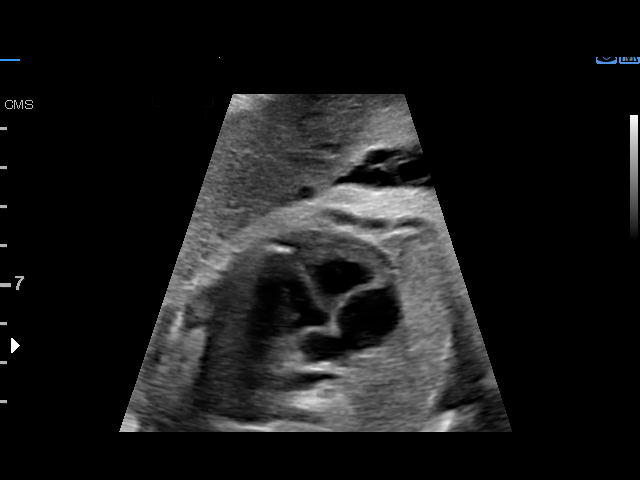
[im 19/56]
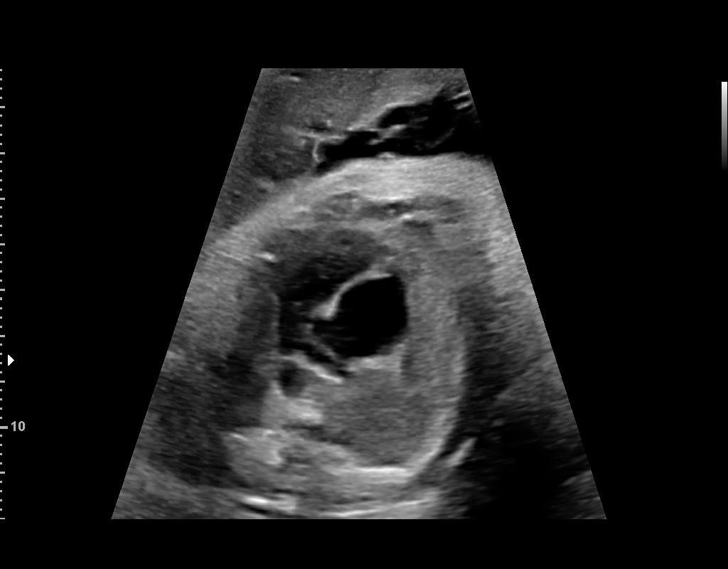
[im 23/56]
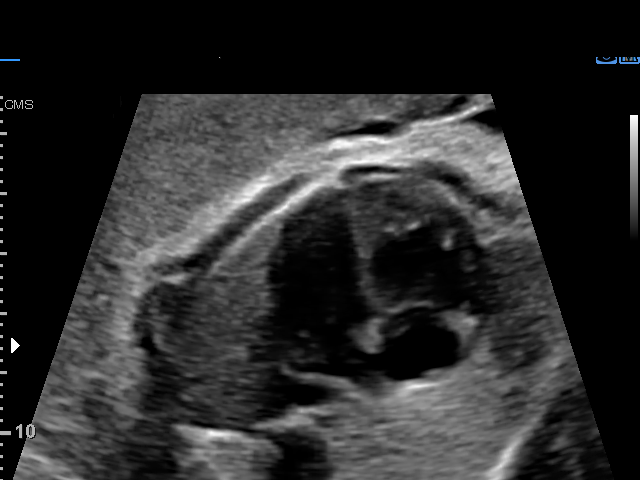
[im 29/56]
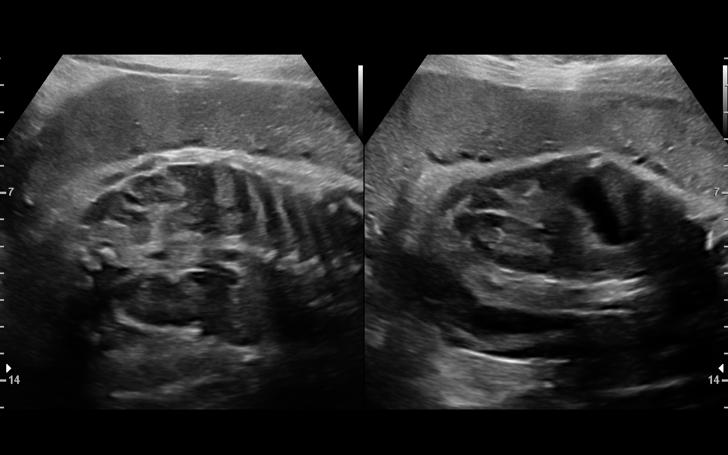
[im 33/56]
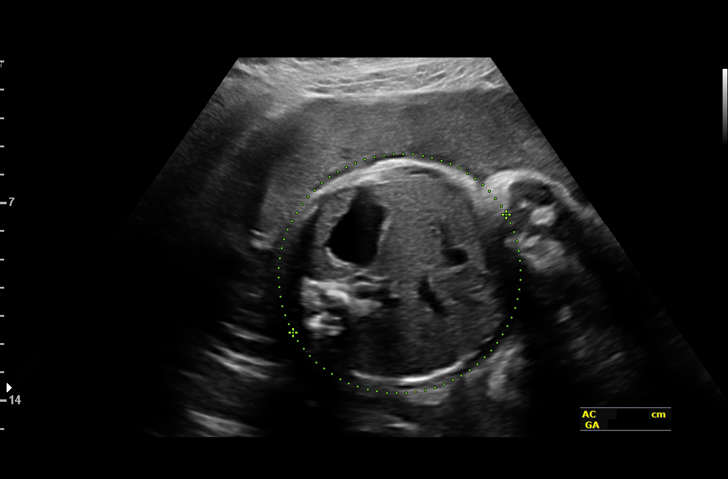
[im 37/56]
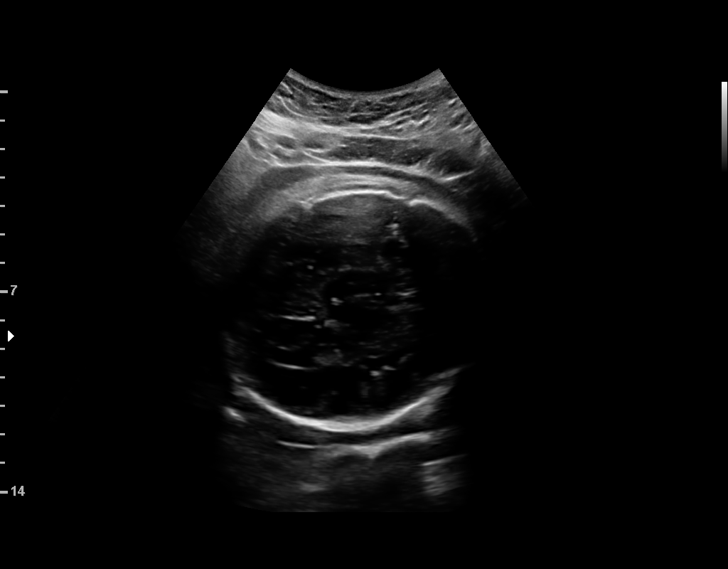
[im 41/56]
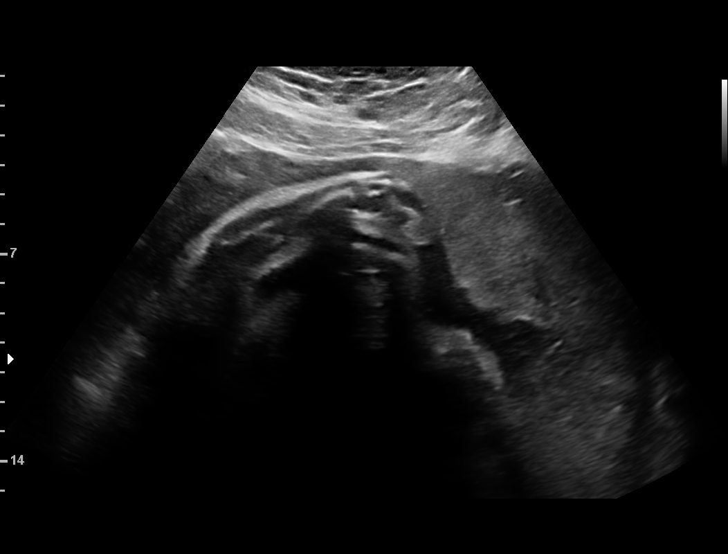
[im 45/56]
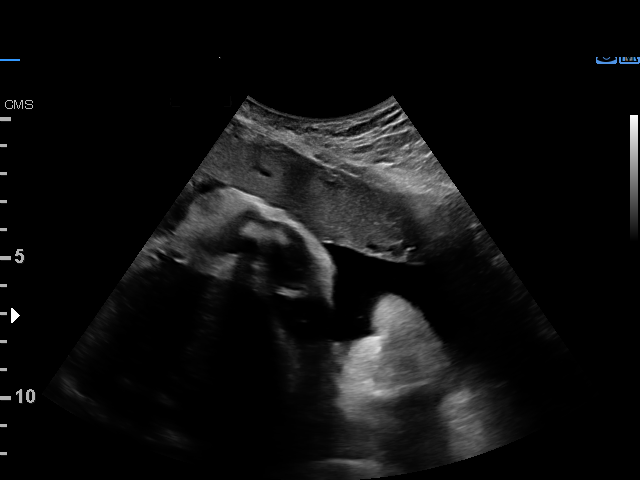
[im 49/56]
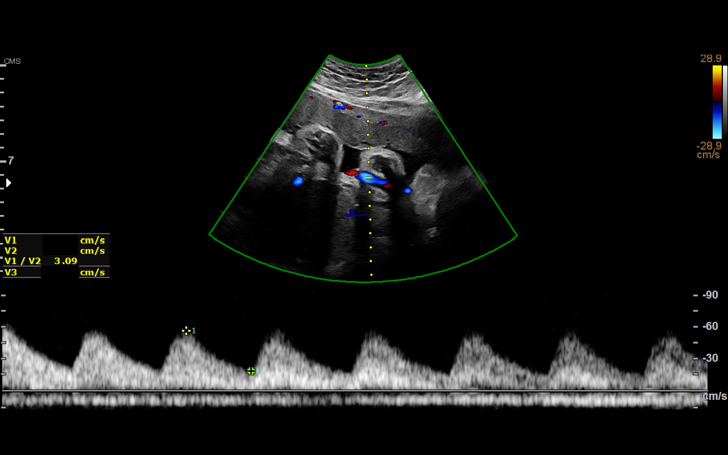
[im 53/56]
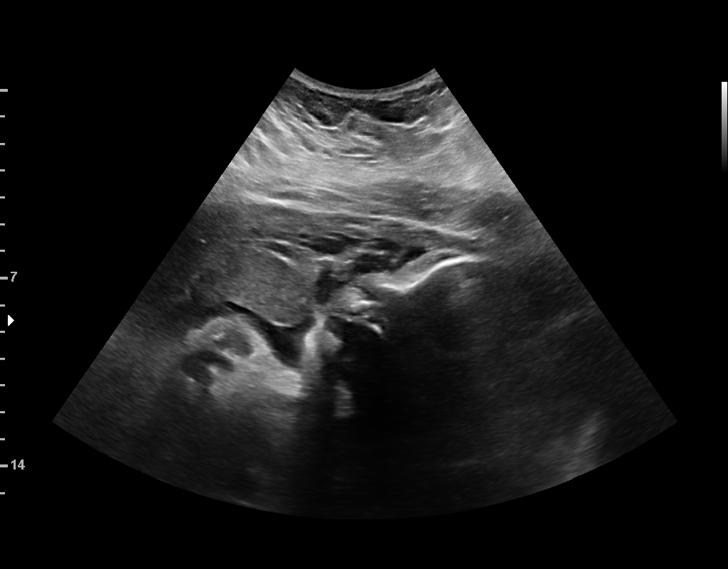

[13 of 28 positions shown; findings below may reference images not displayed]

[REDACTED]care - [HOSPITAL]

1  BERTING HAMPAC            [id number]      [id number]     [id number]
2  BERTING HAMPAC            [id number]      [id number]     [id number]
3  BERTING HAMPAC            [id number]      [id number]     [id number]
Indications

34 weeks gestation of pregnancy
Insufficient Prenatal Care
Obesity complicating pregnancy
Club foot
Maternal care for known or suspected poor
fetal growth, third trimester, not applicable or
unspecified
OB History

Blood Type:            Height:  5'6"   Weight (lb):  224       BMI:
Gravidity:    1
Fetal Evaluation

Num Of Fetuses:     1
Fetal Heart         148
Rate(bpm):
Cardiac Activity:   Observed
Presentation:       Cephalic
Placenta:           Anterior, above cervical os
P. Cord Insertion:  Visualized

Amniotic Fluid
AFI FV:      Subjectively within normal limits
AFI Sum(cm)     %Tile       Largest Pocket(cm)
10.5            24

RUQ(cm)                     LUQ(cm)        LLQ(cm)
2.53
Biophysical Evaluation

Amniotic F.V:   Within normal limits       F. Tone:        Observed
F. Movement:    Observed                   Score:          [DATE]
F. Breathing:   Observed
Biometry

BPD:      82.5  mm     G. Age:  33w 1d         14  %    CI:        77.57   %    70 - 86
FL/HC:      19.5   %    20.1 -
HC:      296.5  mm     G. Age:  32w 6d        < 3  %    HC/AC:      1.09        0.93 -
AC:      271.6  mm     G. Age:  31w 2d        < 3  %    FL/BPD:     69.9   %    71 - 87
FL:       57.7  mm     G. Age:  30w 1d        < 3  %    FL/AC:      21.2   %    20 - 24
HUM:        53  mm     G. Age:  30w 6d        < 5  %

Est. FW:    9891  gm    3 lb 13 oz    < 10  %
Gestational Age

LMP:           34w 4d        Date:  07/26/16                 EDD:   05/02/17
U/S Today:     31w 6d                                        EDD:   05/21/17
Best:          34w 4d     Det. By:  LMP  (07/26/16)          EDD:   05/02/17
Anatomy

Cranium:               Appears normal         Aortic Arch:            Previously seen
Cavum:                 Appears normal         Ductal Arch:            Previously seen
Ventricles:            Appears normal         Diaphragm:              Appears normal
Choroid Plexus:        Previously seen        Stomach:                Appears normal, left
sided
Cerebellum:            Previously seen        Abdomen:                Appears normal
Posterior Fossa:       Previously seen        Abdominal Wall:         Not well visualized
Nuchal Fold:           Not applicable (>20    Cord Vessels:           Previously seen
wks GA)
Face:                  Appears normal         Kidneys:                Appear normal
(orbits and profile)
Lips:                  Previously seen        Bladder:                Appears normal
Thoracic:              Appears normal         Spine:                  Previously seen
Heart:                 Appears normal         Upper Extremities:      Previously seen
(4CH, axis, and situs
RVOT:                  Appears normal         Lower Extremities:      Right clubfoot
LVOT:                  Appears normal

Other:  Female gender previously seen. Nasal bone visualized. Technically
difficult due to maternal habitus and fetal position.
Doppler - Fetal Vessels

Umbilical Artery
S/D     %tile                                            ADFV    RDFV
3.3       88                                                No      No

Cervix Uterus Adnexa
Cervix
Not visualized (advanced GA >29wks)

Uterus
No abnormality visualized.

Left Ovary
Not visualized.

Right Ovary
Not visualized.

Cul De Sac:   No free fluid seen.

Adnexa:       No abnormality visualized.
Impression

Single living intrauterine pregnancy at 34w 4d.
Cephalic presentation.
Placenta Anterior, above cervical os.
Normal amniotic fluid volume.
Composite fetal growth in the < 10%ile. All biometry <3%ile.
Known right club foot.
Otherwise normal interval fetal anatomy.
BPP [DATE].
Normal UA dopplers without absent or reversed flow.
Recommendations

Recommend weekly BPP and UA dopplers.
Growth ultrasound in 3 weeks.
Previously referred to pediatric orthopedics.

## 2018-11-24 ENCOUNTER — Ambulatory Visit (HOSPITAL_COMMUNITY)
Admission: EM | Admit: 2018-11-24 | Discharge: 2018-11-24 | Disposition: A | Discharge status: Home / Self Care | Payer: Self-pay | Attending: Emergency Medicine | Admitting: Emergency Medicine

## 2018-11-24 ENCOUNTER — Other Ambulatory Visit: Payer: Self-pay

## 2018-11-24 ENCOUNTER — Encounter (HOSPITAL_COMMUNITY): Payer: Self-pay

## 2018-11-24 DIAGNOSIS — Z202 Contact with and (suspected) exposure to infections with a predominantly sexual mode of transmission: Secondary | ICD-10-CM | POA: Insufficient documentation

## 2018-11-24 MED ORDER — CEFTRIAXONE SODIUM 250 MG IJ SOLR
INTRAMUSCULAR | Status: AC
  Filled 2018-11-24: qty 250

## 2018-11-24 MED ORDER — METRONIDAZOLE 500 MG PO TABS: 500.0000 mg | ORAL_TABLET | Freq: Two times a day (BID) | ORAL | 0 refills | Status: DC

## 2018-11-24 MED ORDER — STERILE WATER FOR INJECTION IJ SOLN
INTRAMUSCULAR | Status: AC
  Filled 2018-11-24: qty 10

## 2018-11-24 MED ORDER — AZITHROMYCIN 250 MG PO TABS
1000.0000 mg | ORAL_TABLET | Freq: Once | ORAL | Status: AC
  Administered 2018-11-24: 14:00:00 1000 mg via ORAL

## 2018-11-24 MED ORDER — CEFTRIAXONE SODIUM 250 MG IJ SOLR
250.0000 mg | Freq: Once | INTRAMUSCULAR | Status: AC
  Administered 2018-11-24: 14:00:00 250 mg via INTRAMUSCULAR

## 2018-11-24 MED ORDER — AZITHROMYCIN 250 MG PO TABS
ORAL_TABLET | ORAL | Status: AC
  Filled 2018-11-24: qty 4

## 2018-11-24 NOTE — Discharge Instructions (Addendum)
You were treated with two antibiotics today, Rocephin and Zithromax.  Additionally, you are prescribed metronidazole; take this twice a day for 7 days.   ° °Do not have sex for 7 days. ° °Your STD tests are pending.  If your test results are positive, we will call you.  You may need additional treatment and your partner may also need treatment.     ° °

## 2018-11-24 NOTE — ED Triage Notes (Signed)
Pt presents for std testing after possible exposure from partner.
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
## 2018-11-25 LAB — CERVICOVAGINAL ANCILLARY ONLY
Bacterial Vaginitis (gardnerella): POSITIVE — AB
Candida Glabrata: NEGATIVE
Candida Vaginitis: POSITIVE — AB
Molecular Disclaimer: NEGATIVE
Molecular Disclaimer: NEGATIVE
Molecular Disclaimer: NEGATIVE
Molecular Disclaimer: NORMAL
Trichomonas: POSITIVE — AB

## 2018-11-26 ENCOUNTER — Telehealth (HOSPITAL_COMMUNITY): Payer: Self-pay | Admitting: Emergency Medicine

## 2018-11-26 MED ORDER — FLUCONAZOLE 150 MG PO TABS: 150.0000 mg | ORAL_TABLET | Freq: Once | ORAL | 0 refills | Status: AC

## 2018-11-26 MED ORDER — METRONIDAZOLE 500 MG PO TABS: 500.0000 mg | ORAL_TABLET | Freq: Two times a day (BID) | ORAL | 0 refills | Status: AC

## 2018-11-26 NOTE — Telephone Encounter (Signed)
Trichomonas is positive. Rx metronidazole was given at the urgent care visit. Pt needs education to please refrain from sexual intercourse for 7 days to give the medicine time to work. Sexual partners need to be notified and tested/treated. Condoms may reduce risk of reinfection. Recheck for further evaluation if symptoms are not improving.   Bacterial Vaginosis test is positive.  Prescription for metronidazole was given at the urgent care visit. Pt contacted regarding results. Answered all questions. Verbalized understanding.  Test for candida (yeast) was positive.  Prescription for fluconazole 150mg  po now, repeat dose in 3d if needed, #2 no refills, sent to the pharmacy of record.  Recheck or followup with PCP for further evaluation if symptoms are not improving.    Patient contacted and made aware of    results, all questions answered

## 2018-12-01 ENCOUNTER — Telehealth (HOSPITAL_COMMUNITY): Payer: Self-pay | Admitting: Emergency Medicine

## 2018-12-01 NOTE — Telephone Encounter (Signed)
Verified patient with 2 identifiers.  Patient asking about tests results.  Patient reports having a conversation with tina, rn about lab tests and results not resulted.  Patient asking about test results.  Notified patient this nurse will follow up on status of gonorrhea and chlamydia lab tests.    Patient says repeatedly she needs to know whether to buy medicine.    Based on documentation, patient has been told she is positive for BV and yeast and has had scripts either handed to her at visit or called in to preferred pharmacy  To treat these 2 findings.    Patient was also treated for chlamydia and gonorrhea during [facility] at [facility].  Explained whether she is negative or positive-she has been treated.    Encouraged patient to purchase prescribed medicines for confirmed positive lab results and will pursue testing for gonorrhea and chlamydia

## 2019-02-26 IMAGING — CT CT ABD-PELV W/ CM
2 of 4 series · 16 of 46 positions shown, 18 images · IV contrast (omnipaque)
Comparison: 06/07/2016

CLINICAL DATA: Acute abdominal pain with fever

EXAM:
CT ABDOMEN AND PELVIS WITH CONTRAST
TECHNIQUE: Multidetector CT imaging of the abdomen and pelvis was performed
using the standard protocol following bolus administration of
intravenous contrast.
CONTRAST:  100mL OMNIPAQUE IOHEXOL 300 MG/ML  SOLN

[Series 3: abd/ pelvis 5.0 i30f 2 · axial · 0.98mm/px · z∈[+791,+1276]mm · 13 of 107 slices shown, 15 images]
[im 5/107  soft-tissue]
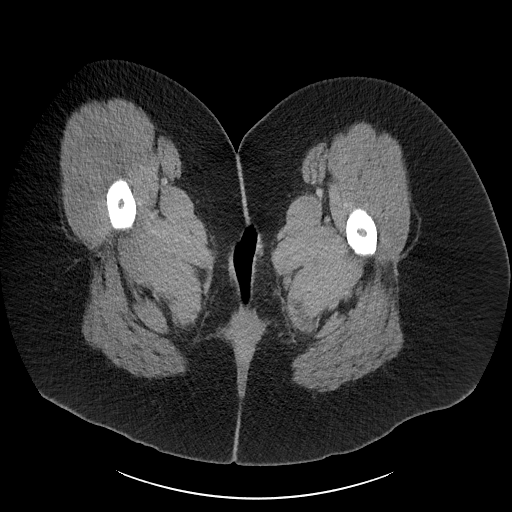
[im 5/107  bone]
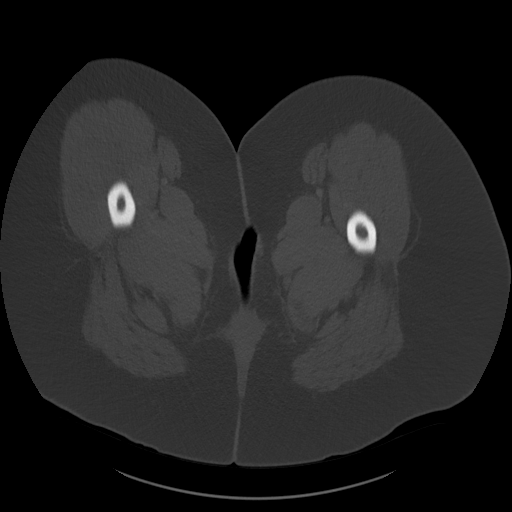
[im 14/107  soft-tissue]
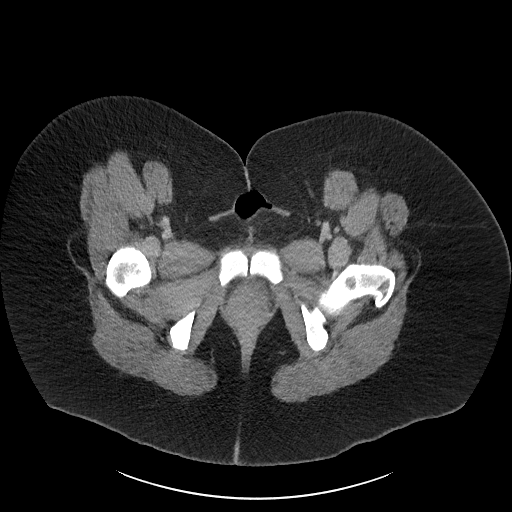
[im 24/107  soft-tissue]
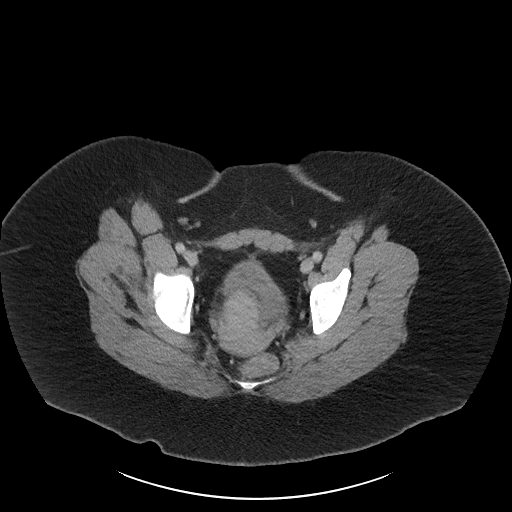
[im 28/107  soft-tissue]
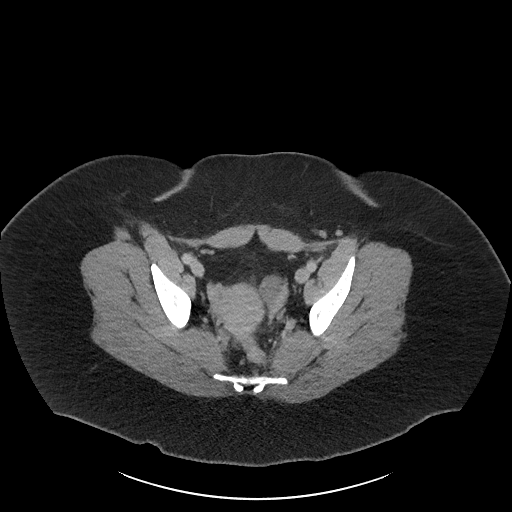
[im 37/107  soft-tissue]
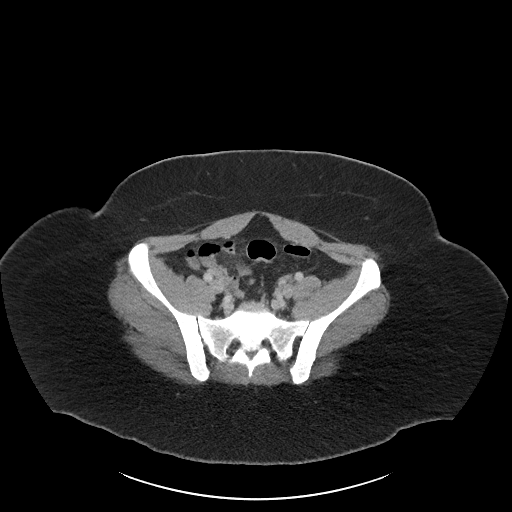
[im 47/107  soft-tissue]
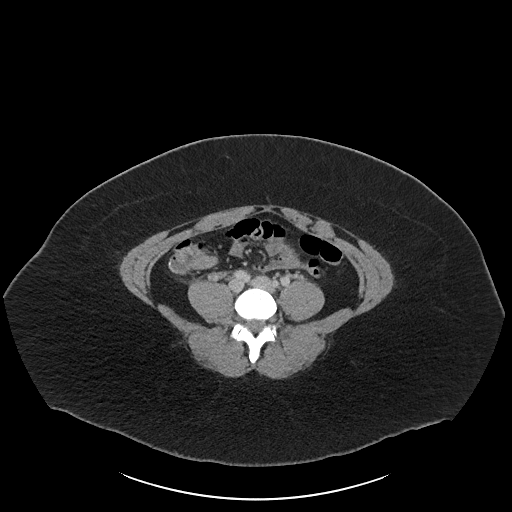
[im 56/107  soft-tissue]
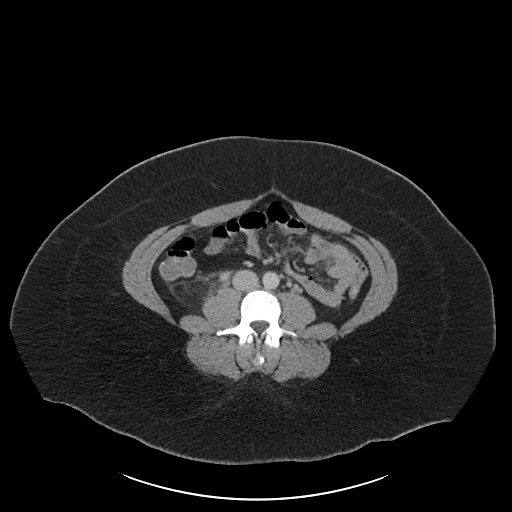
[im 60/107  soft-tissue]
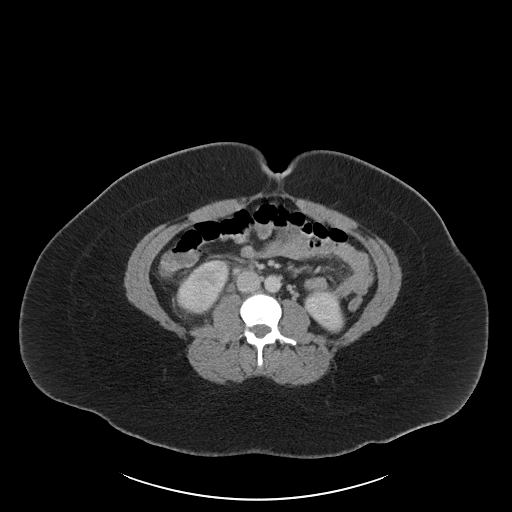
[im 70/107  soft-tissue]
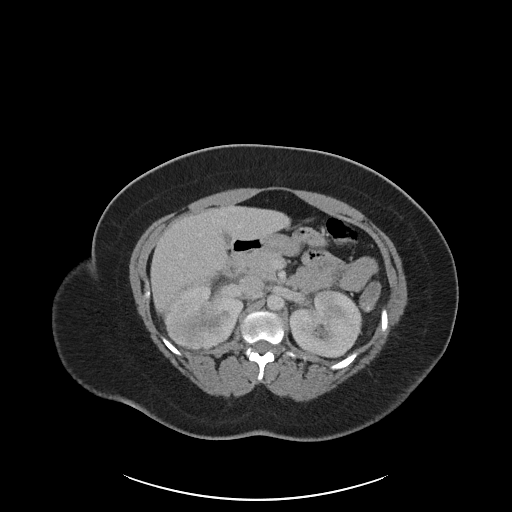
[im 70/107  bone]
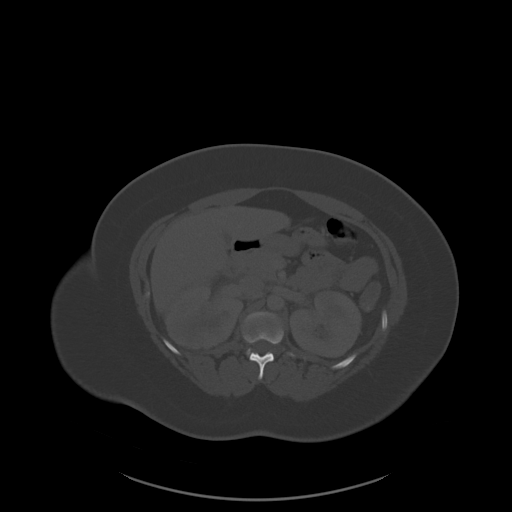
[im 79/107  soft-tissue]
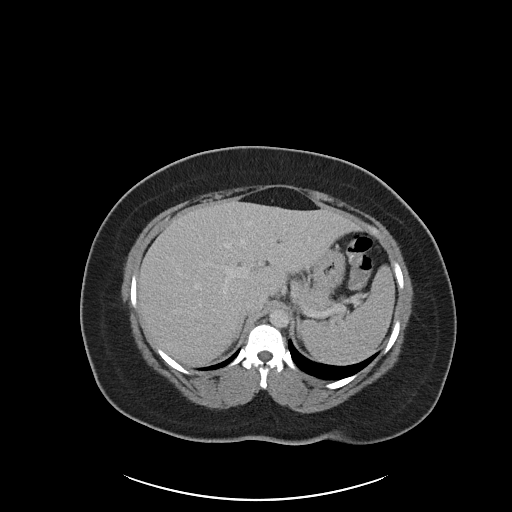
[im 83/107  soft-tissue]
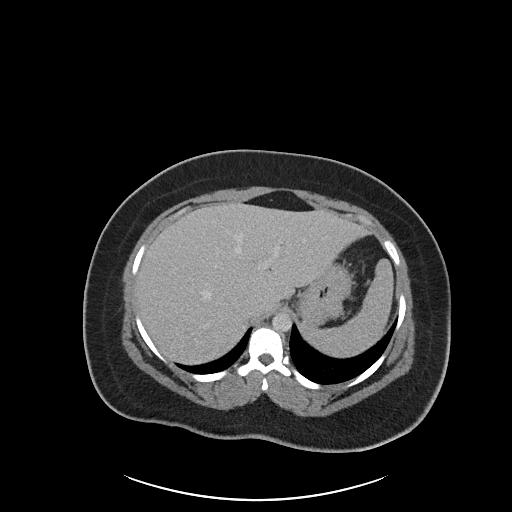
[im 93/107  soft-tissue]
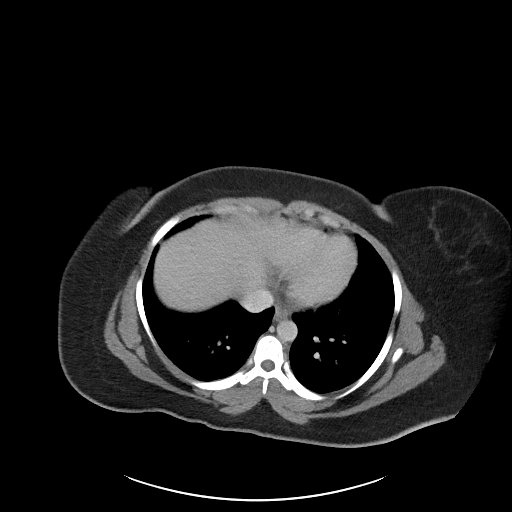
[im 102/107  soft-tissue]
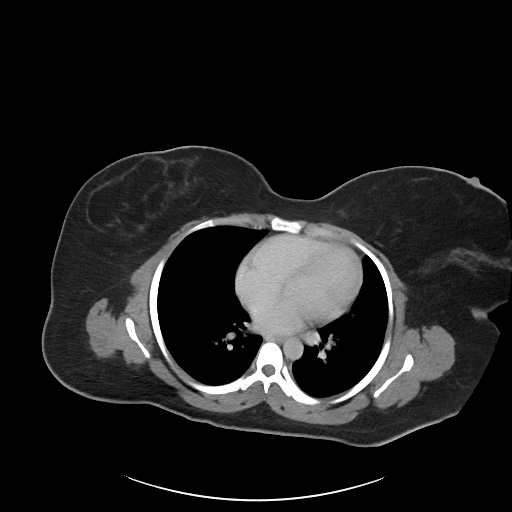

[Series 6: coronal soft tissue · coronal · 1.04mm/px · 3 of 95 slices shown]
[im 32/95  soft-tissue]
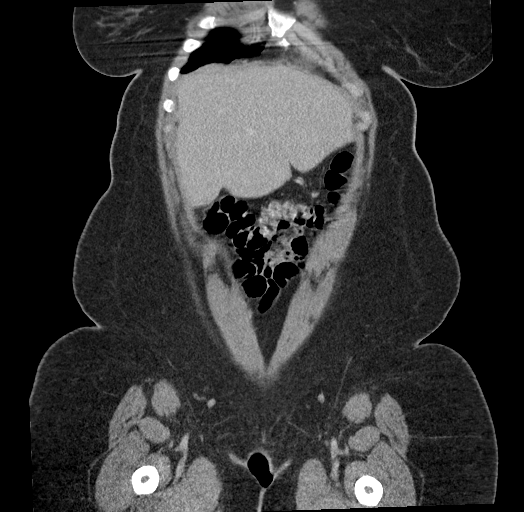
[im 42/95  soft-tissue]
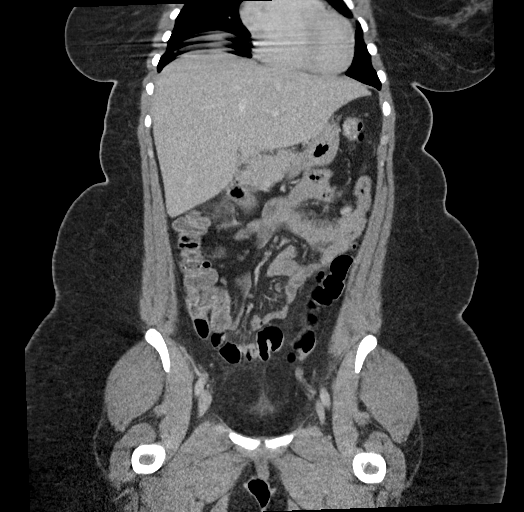
[im 53/95  soft-tissue]
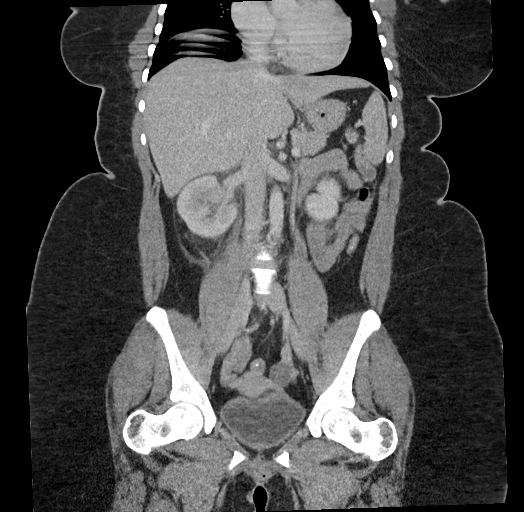

[16 of 46 positions shown; findings below may reference images not displayed]

FINDINGS: Lower chest: No acute abnormality.

Hepatobiliary: No focal liver abnormality is seen. No gallstones,
gallbladder wall thickening, or biliary dilatation.

Pancreas: Unremarkable. No pancreatic ductal dilatation or
surrounding inflammatory changes.

Spleen: Normal in size without focal abnormality.

Adrenals/Urinary Tract: Normal adrenal glands.

Right kidney demonstrates striated patchy nephrogram throughout with
perinephric strandy edema/inflammation. Inflammation and edema
extends along the right retroperitoneal space over the ileus psoas
muscle and along the right ureter. No obstructing urinary tract or
ureteral calculus. No associated hydronephrosis. Appearance is
compatible with ascending urinary tract infection or pyelonephritis.

Left kidney and ureter demonstrate no acute process or obstruction.

Urinary bladder unremarkable.

Stomach/Bowel: Negative for bowel obstruction, significant
dilatation, ileus, or free air. Appendix is visualized extending
medially without inflammation or distension and contains an
appendicolith peripherally, images 70 series 3.

No free fluid, fluid collection, hemorrhage or abscess.

Vascular/Lymphatic: No significant vascular findings are present. No
enlarged abdominal or pelvic lymph nodes.

Reproductive: Uterus and bilateral adnexa are unremarkable.

Other: No abdominal wall hernia or abnormality. No abdominopelvic
ascites.

Musculoskeletal: No acute or significant osseous findings.
IMPRESSION: Acute right perinephric strandy edema, patchy diffuse right kidney
striated nephrogram without hydronephrosis or obstructing ureteral
calculus. Right kidney findings are more compatible with ascending
urinary tract infection or pyelonephritis.

## 2020-04-23 ENCOUNTER — Emergency Department (HOSPITAL_COMMUNITY)
Admission: EM | Admit: 2020-04-23 | Discharge: 2020-04-23 | Disposition: A | Discharge status: Home / Self Care | Payer: Self-pay | Attending: Emergency Medicine | Admitting: Emergency Medicine

## 2020-04-23 ENCOUNTER — Other Ambulatory Visit: Payer: Self-pay

## 2020-04-23 ENCOUNTER — Encounter (HOSPITAL_COMMUNITY): Payer: Self-pay | Admitting: *Deleted

## 2020-04-23 DIAGNOSIS — M545 Low back pain, unspecified: Secondary | ICD-10-CM | POA: Insufficient documentation

## 2020-04-23 DIAGNOSIS — M25511 Pain in right shoulder: Secondary | ICD-10-CM | POA: Insufficient documentation

## 2020-04-23 DIAGNOSIS — M542 Cervicalgia: Secondary | ICD-10-CM | POA: Insufficient documentation

## 2020-04-23 DIAGNOSIS — M25561 Pain in right knee: Secondary | ICD-10-CM | POA: Insufficient documentation

## 2020-04-23 DIAGNOSIS — Y92481 Parking lot as the place of occurrence of the external cause: Secondary | ICD-10-CM | POA: Insufficient documentation

## 2020-04-23 DIAGNOSIS — M7918 Myalgia, other site: Secondary | ICD-10-CM

## 2020-04-23 DIAGNOSIS — M25551 Pain in right hip: Secondary | ICD-10-CM | POA: Insufficient documentation

## 2020-04-23 DIAGNOSIS — M791 Myalgia, unspecified site: Secondary | ICD-10-CM | POA: Insufficient documentation

## 2020-04-23 DIAGNOSIS — J45909 Unspecified asthma, uncomplicated: Secondary | ICD-10-CM | POA: Insufficient documentation

## 2020-04-23 MED ORDER — IBUPROFEN 600 MG PO TABS: 600.0000 mg | ORAL_TABLET | Freq: Four times a day (QID) | ORAL | 0 refills | Status: AC | PRN

## 2020-04-23 MED ORDER — IBUPROFEN 400 MG PO TABS
600.0000 mg | ORAL_TABLET | Freq: Once | ORAL | Status: AC
  Administered 2020-04-23: 600 mg via ORAL
  Filled 2020-04-23: qty 1

## 2020-04-23 MED ORDER — METHOCARBAMOL 500 MG PO TABS: 500.0000 mg | ORAL_TABLET | Freq: Two times a day (BID) | ORAL | 0 refills | Status: AC

## 2020-04-23 MED ORDER — METHOCARBAMOL 500 MG PO TABS: 500.0000 mg | ORAL_TABLET | Freq: Two times a day (BID) | ORAL | 0 refills | Status: DC

## 2020-04-23 MED ORDER — IBUPROFEN 600 MG PO TABS: 600.0000 mg | ORAL_TABLET | Freq: Four times a day (QID) | ORAL | 0 refills | Status: DC | PRN
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
## 2020-04-23 NOTE — ED Notes (Signed)
ED Provider at bedside. 
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
## 2021-11-15 ENCOUNTER — Emergency Department (HOSPITAL_COMMUNITY)
Admission: EM | Admit: 2021-11-15 | Discharge: 2021-11-15 | Disposition: A | Discharge status: Home / Self Care | Payer: Self-pay | Attending: Emergency Medicine | Admitting: Emergency Medicine

## 2021-11-15 ENCOUNTER — Encounter (HOSPITAL_COMMUNITY): Payer: Self-pay | Admitting: Emergency Medicine

## 2021-11-15 DIAGNOSIS — H5712 Ocular pain, left eye: Secondary | ICD-10-CM | POA: Insufficient documentation

## 2021-11-15 MED ORDER — ACETAMINOPHEN 500 MG PO TABS: 1000.0000 mg | ORAL_TABLET | Freq: Four times a day (QID) | ORAL | Status: DC | PRN

## 2021-11-15 MED ORDER — TETRACAINE HCL 0.5 % OP SOLN
1.0000 [drp] | Freq: Once | OPHTHALMIC | Status: DC
  Filled 2021-11-15: qty 4

## 2021-11-15 MED ORDER — FLUORESCEIN SODIUM 1 MG OP STRP
1.0000 | ORAL_STRIP | Freq: Once | OPHTHALMIC | Status: DC
  Filled 2021-11-15: qty 1

## 2021-11-15 MED ORDER — IBUPROFEN 800 MG PO TABS
800.0000 mg | ORAL_TABLET | Freq: Once | ORAL | Status: AC
  Administered 2021-11-15: 800 mg via ORAL
  Filled 2021-11-15: qty 1

## 2021-11-15 NOTE — ED Triage Notes (Signed)
Pt here from home with c/o left eye pain /burring, very tearful in triage , pt states that she tried clears eyes and visine but it did not help

## 2021-11-15 NOTE — ED Provider Notes (Addendum)
[facility]-EMERGENCY DEPT Provider Note   CSN: [id number] Arrival date & time: 11/15/21  0736     History  Chief Complaint  Patient presents with   Eye Pain    Anne Jensen is a 26 y.o. female previously healthy who presents to the ED for evaluation of severe left eye pain and burning.  Patient states yesterday, she developed milky discharge and administered several drops of both clear eyes and Visine eyedrops, however this significantly worsened her pain.  She denies vision changes, pain with eye movements although having her eyes closed is more painful than having them open.  Describes the pain as burning.  Associated symptoms include photophobia.  Denies itching, injury, foreign body, fevers, chills.   Eye Pain Pertinent negatives include no abdominal pain.       Home Medications Prior to Admission medications   Medication Sig Start Date End Date Taking? Authorizing Provider  ibuprofen (ADVIL) 600 MG tablet Take 1 tablet (600 mg total) by mouth every 6 (six) hours as needed. 04/23/20   McDonald, Mia A, PA-C  methocarbamol (ROBAXIN) 500 MG tablet Take 1 tablet (500 mg total) by mouth 2 (two) times daily. 04/23/20   McDonald, Mia A, PA-C  ferrous sulfate 325 (65 FE) MG tablet Take 1 tablet (325 mg total) by mouth daily with breakfast. Patient not taking: Reported on 06/05/2017 03/06/17 04/23/20  Brock Bad, MD      Allergies    Morphine and related    Review of Systems   Review of Systems  Constitutional:  Negative for fever.  Eyes:  Positive for pain and redness. Negative for visual disturbance.  Gastrointestinal:  Negative for abdominal pain.    Physical Exam Updated Vital Signs BP (!) 149/86 (BP Location: Right Arm)   Pulse 85   Temp 98.1 F (36.7 C) (Oral)   Resp 20   SpO2 99%  Physical Exam Vitals and nursing note reviewed.  Constitutional:      General: She is in acute distress.     Appearance: She is not ill-appearing.      Comments: Crying, shaking in pain, crying out with each blink of left eye  HENT:     Head: Atraumatic.  Eyes:     General: Vision grossly intact.        Right eye: No foreign body or hordeolum.        Left eye: No foreign body or hordeolum.     Extraocular Movements: Extraocular movements intact.     Right eye: No nystagmus.     Left eye: No nystagmus.     Conjunctiva/sclera:     Left eye: Left conjunctiva is injected. No exudate or hemorrhage.    Comments: Left eye with mild scleral injection. Eyes PERRL. EOM intact without pain.     Cardiovascular:     Rate and Rhythm: Normal rate and regular rhythm.     Pulses: Normal pulses.     Heart sounds: No murmur heard. Pulmonary:     Effort: Pulmonary effort is normal. No respiratory distress.     Breath sounds: Normal breath sounds.  Abdominal:     General: Abdomen is flat. There is no distension.     Palpations: Abdomen is soft.     Tenderness: There is no abdominal tenderness.  Musculoskeletal:        General: Normal range of motion.     Cervical back: Normal range of motion.  Skin:    General: Skin is warm  and dry.     Capillary Refill: Capillary refill takes less than 2 seconds.     Comments: No surrounding skin changes such as swelling or erythema to the eyes  Neurological:     General: No focal deficit present.     Mental Status: She is alert.  Psychiatric:        Mood and Affect: Mood normal.     ED Results / Procedures / Treatments   Labs (all labs ordered are listed, but only abnormal results are displayed) Labs Reviewed - No data to display  EKG None  Radiology No results found.  Procedures Procedures    Medications Ordered in ED Medications  tetracaine (PONTOCAINE) 0.5 % ophthalmic solution 1 drop (has no administration in time range)  fluorescein ophthalmic strip 1 strip (has no administration in time range)  acetaminophen (TYLENOL) tablet 1,000 mg (has no administration in time range)  ibuprofen  (ADVIL) tablet 800 mg (800 mg Oral Given 11/15/21 1009)    ED Course/ Medical Decision Making/ A&P                           Medical Decision Making Risk OTC drugs. Prescription drug management.   Patient presents acutely distressed although non-toxic. She is tearful, shaking in pain and crying out every time she touches or blinks her left eye. Eyes PERRL. EOM intact without pain. No evidence of surrounding cellulitic skin changes or bruising. Fluorescein stain negative for abrasion or ulcer. She has no relief with tetracaine drops and actually endorses possible worsening of pain. Visual acuity 20/100 left eye, 20/50 right eye. Per EMT-P, patient reportedly broke down during visual acuity exam of left eye and was unable to complete it due to pain. Unable to determine pressures as patient will not tolerate tonopen. Patient's physical exam is overall unimpressive, but her pain appears acutely out of proportion to clinical findings. I had my attending Dr Jearld Fenton also evaluate patient who agrees that patient's pain is not consistent with typical conjunctivitis. Given this I do think she needs to see ophthalmology. I placed consult and spoke with Green at opthalmology. She advises patient comes straight to office for evaluation. Patient agreeable to plan and discharged home in good condition.   Final Clinical Impression(s) / ED Diagnoses Final diagnoses:  Left eye pain    Rx / DC Orders ED Discharge Orders     None         Janell Quiet, PA-C 11/15/21 1056     Loetta Rough, MD 11/18/21 1221

## 2021-11-15 NOTE — Discharge Instructions (Signed)
Please go straight to the eye doctor's office upon leaving here.
